# Patient Record
Sex: Male | Born: 1960
Health system: Southern US, Community
[De-identification: ages and names within clinical notes are randomized; demographics above are authoritative.]

## PROBLEM LIST (undated history)

## (undated) DIAGNOSIS — M19019 Primary osteoarthritis, unspecified shoulder: Secondary | ICD-10-CM

## (undated) DIAGNOSIS — M1990 Unspecified osteoarthritis, unspecified site: Secondary | ICD-10-CM

## (undated) DIAGNOSIS — I10 Essential (primary) hypertension: Secondary | ICD-10-CM

## (undated) DIAGNOSIS — Z789 Other specified health status: Secondary | ICD-10-CM

## (undated) DIAGNOSIS — M75102 Unspecified rotator cuff tear or rupture of left shoulder, not specified as traumatic: Secondary | ICD-10-CM

## (undated) DIAGNOSIS — M50221 Other cervical disc displacement at C4-C5 level: Secondary | ICD-10-CM

## (undated) HISTORY — DX: Primary osteoarthritis, unspecified shoulder: M19.019

## (undated) HISTORY — DX: Other cervical disc displacement at C4-C5 level: M50.221

## (undated) HISTORY — DX: Essential (primary) hypertension: I10

## (undated) HISTORY — DX: Unspecified rotator cuff tear or rupture of left shoulder, not specified as traumatic: M75.102

## (undated) HISTORY — PX: ROTATOR CUFF REPAIR: SHX139

---

## 1993-10-30 HISTORY — PX: KNEE ARTHROSCOPY W/ ACL RECONSTRUCTION: SHX1858

## 1998-08-31 ENCOUNTER — Encounter: Payer: Self-pay | Admitting: Neurosurgery

## 1998-08-31 ENCOUNTER — Ambulatory Visit (HOSPITAL_COMMUNITY): Admission: RE | Admit: 1998-08-31 | Discharge: 1998-08-31 | Payer: Self-pay | Admitting: Neurosurgery

## 1999-10-10 ENCOUNTER — Encounter: Payer: Self-pay | Admitting: Neurosurgery

## 1999-10-10 ENCOUNTER — Encounter: Admission: RE | Admit: 1999-10-10 | Discharge: 1999-10-10 | Payer: Self-pay | Admitting: Neurosurgery

## 1999-11-06 ENCOUNTER — Encounter: Payer: Self-pay | Admitting: Neurosurgery

## 1999-11-06 ENCOUNTER — Ambulatory Visit (HOSPITAL_COMMUNITY): Admission: RE | Admit: 1999-11-06 | Discharge: 1999-11-06 | Payer: Self-pay | Admitting: Neurosurgery

## 1999-12-23 ENCOUNTER — Encounter: Payer: Self-pay | Admitting: Neurosurgery

## 1999-12-23 ENCOUNTER — Ambulatory Visit (HOSPITAL_COMMUNITY): Admission: RE | Admit: 1999-12-23 | Discharge: 1999-12-23 | Payer: Self-pay | Admitting: Neurosurgery

## 2000-09-30 ENCOUNTER — Encounter: Payer: Self-pay | Admitting: Neurosurgery

## 2000-09-30 ENCOUNTER — Ambulatory Visit (HOSPITAL_COMMUNITY): Admission: RE | Admit: 2000-09-30 | Discharge: 2000-09-30 | Payer: Self-pay | Admitting: Neurosurgery

## 2001-09-02 ENCOUNTER — Encounter: Payer: Self-pay | Admitting: Neurosurgery

## 2001-09-02 ENCOUNTER — Ambulatory Visit (HOSPITAL_COMMUNITY): Admission: RE | Admit: 2001-09-02 | Discharge: 2001-09-02 | Payer: Self-pay | Admitting: Neurosurgery

## 2002-10-30 HISTORY — PX: BICEPS TENDON REPAIR: SHX566

## 2003-04-22 ENCOUNTER — Ambulatory Visit (HOSPITAL_COMMUNITY): Admission: RE | Admit: 2003-04-22 | Discharge: 2003-04-22 | Payer: Self-pay | Admitting: Sports Medicine

## 2003-04-23 ENCOUNTER — Ambulatory Visit (HOSPITAL_COMMUNITY): Admission: RE | Admit: 2003-04-23 | Discharge: 2003-04-23 | Payer: Self-pay | Admitting: *Deleted

## 2003-04-23 ENCOUNTER — Encounter: Payer: Self-pay | Admitting: *Deleted

## 2003-04-27 ENCOUNTER — Ambulatory Visit (HOSPITAL_BASED_OUTPATIENT_CLINIC_OR_DEPARTMENT_OTHER): Admission: RE | Admit: 2003-04-27 | Discharge: 2003-04-28 | Payer: Self-pay | Admitting: Orthopedic Surgery

## 2004-10-16 ENCOUNTER — Ambulatory Visit (HOSPITAL_COMMUNITY): Admission: RE | Admit: 2004-10-16 | Discharge: 2004-10-16 | Payer: Self-pay | Admitting: Neurosurgery

## 2005-11-06 ENCOUNTER — Emergency Department (HOSPITAL_COMMUNITY): Admission: EM | Admit: 2005-11-06 | Discharge: 2005-11-06 | Payer: Self-pay | Admitting: Emergency Medicine

## 2008-12-02 ENCOUNTER — Emergency Department (HOSPITAL_COMMUNITY): Admission: EM | Admit: 2008-12-02 | Discharge: 2008-12-02 | Payer: Self-pay | Admitting: Family Medicine

## 2008-12-23 ENCOUNTER — Emergency Department (HOSPITAL_COMMUNITY): Admission: EM | Admit: 2008-12-23 | Discharge: 2008-12-23 | Payer: Self-pay | Admitting: Family Medicine

## 2009-01-01 ENCOUNTER — Emergency Department (HOSPITAL_COMMUNITY): Admission: EM | Admit: 2009-01-01 | Discharge: 2009-01-01 | Payer: Self-pay | Admitting: Family Medicine

## 2009-01-12 ENCOUNTER — Ambulatory Visit (HOSPITAL_COMMUNITY): Admission: RE | Admit: 2009-01-12 | Discharge: 2009-01-12 | Payer: Self-pay | Admitting: Neurosurgery

## 2011-01-09 ENCOUNTER — Ambulatory Visit
Admission: RE | Admit: 2011-01-09 | Discharge: 2011-01-09 | Disposition: A | Discharge status: Home / Self Care | Payer: Commercial Managed Care - PPO | Source: Ambulatory Visit | Attending: Neurosurgery | Admitting: Neurosurgery

## 2011-01-09 ENCOUNTER — Other Ambulatory Visit: Payer: Self-pay | Admitting: Neurosurgery

## 2011-01-09 DIAGNOSIS — R269 Unspecified abnormalities of gait and mobility: Secondary | ICD-10-CM

## 2011-02-09 LAB — POCT URINALYSIS DIP (DEVICE)
Glucose, UA: NEGATIVE mg/dL
Nitrite: NEGATIVE
Urobilinogen, UA: 1 mg/dL (ref 0.0–1.0)

## 2011-03-17 NOTE — H&P (Signed)
NAME:  Alex Caldwell, Alex Caldwell                         ACCOUNT NO.:  [id number]   MEDICAL RECORD NO.:  [id number]                   PATIENT TYPE:  AMB   LOCATION:  DSC                                  FACILITY:  [facility]   PHYSICIAN:  Robert A. Thurston Hole, M.D.              DATE OF BIRTH:  Jan 09, 1961   DATE OF ADMISSION:  04/27/2003  DATE OF DISCHARGE:                                HISTORY & PHYSICAL   PREOPERATIVE DIAGNOSIS:  Left elbow distal biceps tendon rupture.   POSTOPERATIVE DIAGNOSIS:  Left elbow distal biceps tendon rupture.   PROCEDURES:  Left distal biceps tendon rupture repair.   SURGEON:  Elana Alm. Thurston Hole, M.D.   ASSISTANT:  Jonelle Sports. Frazier Richards, M.D.   ANESTHESIA:  General.   OPERATIVE TIME:  One hour.   COMPLICATIONS:  None.   INDICATIONS FOR PROCEDURE:  Alex Caldwell is a 50 year old gentleman who  sustained a left biceps tendon distal ruptured approximately two weeks ago.  Significant pain and disability.  He is now to undergo primary repair of  this.   DESCRIPTION OF PROCEDURE:  Alex Caldwell was brought to the operating room on  April 27, 2003, and placed on the operating table in the supine position.  After an adequate level of general anesthesia was obtained, his left arm was  prepped using sterile Duraprep and draped using sterile technique.  He  received Ancef 1 g IV preoperatively for prophylaxis.  The arm was  exsanguinated and the tourniquet elevated 275 mm.  Initially through a 7 cm  S-type incision based at the flexion crease of the elbow initial exposure  was made.  The underlying subcutaneous tissues were incised in line with the  skin incision.  The fascia over the biceps was exposed and carefully  dissected.  The brachial vein was carefully retracted.  Neurovascular bundle  carefully retracted while the ruptured tendon was easily identified.  It was  thoroughly debrided back to healthy-appearing tendon.  A weaving-type  locking stitch of #2 fiber wire was then  placed in it.  After this was done,  careful dissection was carried down to the radial head to the bicipital  tubercle of the proximal radius.  A small drill bit was then drilled into  the bicipital tuberosity and confirmed with fluoroscopy.  After this was  done, then an 8 mm drill was drilled over this, thus providing a bed for the  biceps tendon.  After this was done, then using the Arthrex biceps tenodesis  interference screw this was placed on the distal biceps tendon and then the  biceps tendon placed into the hole in the biceps tuberosity on the proximal  radius and then the screw screwed down in this with an excellent  interference fit.  After this was done, the arm could be brought through a  full range of motion with only slight excessive tension on the biceps in  full extension.  At this point then, the tourniquet was released.  No  excessive bleeding was encountered.  Small skin bleeders were cauterized.  The wound was then irrigated.  The wound was then closed with 2-0 Vicryl and  skin staples.  The patient then had a long arm splint placed.  He was then  awaken, extubated, and taken to recovery in stable condition.   FOLLOWUP CARE:  Alex Caldwell will be followed overnight in the recovery care  center for IV pain control and neurovascular monitoring.  Discharged  tomorrow on Percocet for pain.  Will see him back in the office in a week  for wound check and followup.                                               Robert A. Thurston Hole, M.D.    RAW/MEDQ  D:  04/27/2003  T:  04/27/2003  Job:  [id number]

## 2011-09-13 ENCOUNTER — Other Ambulatory Visit: Payer: Commercial Managed Care - PPO | Admitting: Internal Medicine

## 2012-01-11 ENCOUNTER — Other Ambulatory Visit (HOSPITAL_COMMUNITY): Payer: Self-pay | Admitting: Sports Medicine

## 2012-01-11 DIAGNOSIS — R1030 Lower abdominal pain, unspecified: Secondary | ICD-10-CM

## 2012-01-12 ENCOUNTER — Ambulatory Visit (HOSPITAL_COMMUNITY)
Admission: RE | Admit: 2012-01-12 | Discharge: 2012-01-12 | Disposition: A | Discharge status: Home / Self Care | Payer: 59 | Source: Ambulatory Visit | Attending: Sports Medicine | Admitting: Sports Medicine

## 2012-01-12 DIAGNOSIS — R1030 Lower abdominal pain, unspecified: Secondary | ICD-10-CM

## 2012-01-12 DIAGNOSIS — X58XXXA Exposure to other specified factors, initial encounter: Secondary | ICD-10-CM | POA: Insufficient documentation

## 2012-01-12 DIAGNOSIS — R109 Unspecified abdominal pain: Secondary | ICD-10-CM | POA: Insufficient documentation

## 2012-01-12 DIAGNOSIS — IMO0002 Reserved for concepts with insufficient information to code with codable children: Secondary | ICD-10-CM | POA: Insufficient documentation

## 2012-06-19 ENCOUNTER — Other Ambulatory Visit (HOSPITAL_COMMUNITY): Payer: Self-pay | Admitting: Family Medicine

## 2012-06-19 DIAGNOSIS — S43439A Superior glenoid labrum lesion of unspecified shoulder, initial encounter: Secondary | ICD-10-CM

## 2012-06-24 ENCOUNTER — Ambulatory Visit (HOSPITAL_COMMUNITY)
Admission: RE | Admit: 2012-06-24 | Discharge: 2012-06-24 | Disposition: A | Discharge status: Home / Self Care | Payer: 59 | Source: Ambulatory Visit | Attending: Family Medicine | Admitting: Family Medicine

## 2012-06-24 DIAGNOSIS — S46819A Strain of other muscles, fascia and tendons at shoulder and upper arm level, unspecified arm, initial encounter: Secondary | ICD-10-CM | POA: Insufficient documentation

## 2012-06-24 DIAGNOSIS — M19019 Primary osteoarthritis, unspecified shoulder: Secondary | ICD-10-CM | POA: Insufficient documentation

## 2012-06-24 DIAGNOSIS — S43439A Superior glenoid labrum lesion of unspecified shoulder, initial encounter: Secondary | ICD-10-CM

## 2012-06-24 DIAGNOSIS — M25519 Pain in unspecified shoulder: Secondary | ICD-10-CM | POA: Insufficient documentation

## 2012-06-24 DIAGNOSIS — X58XXXA Exposure to other specified factors, initial encounter: Secondary | ICD-10-CM | POA: Insufficient documentation

## 2012-06-24 MED ORDER — IOHEXOL 180 MG/ML  SOLN
5.0000 mL | Freq: Once | INTRAMUSCULAR | Status: AC | PRN
  Administered 2012-06-24: 5 mL via INTRAVENOUS

## 2012-06-24 MED ORDER — GADOBENATE DIMEGLUMINE 529 MG/ML IV SOLN
0.5000 mL | Freq: Once | INTRAVENOUS | Status: AC | PRN
  Administered 2012-06-24: 0.5 mL via INTRAVENOUS

## 2012-06-25 ENCOUNTER — Other Ambulatory Visit (HOSPITAL_COMMUNITY): Payer: 59

## 2012-07-16 ENCOUNTER — Encounter (HOSPITAL_BASED_OUTPATIENT_CLINIC_OR_DEPARTMENT_OTHER): Payer: Self-pay | Admitting: *Deleted

## 2012-07-16 NOTE — Progress Notes (Signed)
No heart or resp problems-no labs needed Come with overnight bag in case he needs to stay

## 2012-07-22 ENCOUNTER — Other Ambulatory Visit: Payer: Self-pay | Admitting: Physician Assistant

## 2012-07-22 ENCOUNTER — Encounter: Payer: Self-pay | Admitting: Physician Assistant

## 2012-07-22 DIAGNOSIS — M1990 Unspecified osteoarthritis, unspecified site: Secondary | ICD-10-CM

## 2012-07-22 DIAGNOSIS — M75102 Unspecified rotator cuff tear or rupture of left shoulder, not specified as traumatic: Secondary | ICD-10-CM

## 2012-07-22 DIAGNOSIS — M19019 Primary osteoarthritis, unspecified shoulder: Secondary | ICD-10-CM | POA: Insufficient documentation

## 2012-07-22 NOTE — H&P (Addendum)
Alex Caldwell is an 51 y.o. male.   Chief Complaint: left shoulder Rotator cuff repair HPI: Alex Caldwell is a 51 year old seen for evaluation of significant left shoulder pain that began with a hockey injury when he was swinging the stick and felt very sharp pain. He saw Dr. Dion Saucier and had a subacromial injection which helped temporarily. His pain has persisted and increased. He had an MRI arthrogram on 06/24/12 that has revealed a rotator cuff tear and mild AC joint spurring. He continues to have significant pain and night pain as well. We had previously performed a distal biceps tendon rupture repair on him in 2004 and this has done well.  Past Medical History  Diagnosis Date  . No pertinent past medical history   . DJD (degenerative joint disease)     Past Surgical History  Procedure Date  . Knee arthroscopy w/ acl reconstruction 1995    left  . Biceps tendon repair 2004    left    No family history on file. Social History:  reports that he has never smoked. He does not have any smokeless tobacco history on file. He reports that he drinks alcohol. He reports that he does not use illicit drugs.  Allergies: No Known Allergies   (Not in a hospital admission)  No results found for this or any previous visit (from the past 48 hour(s)). No results found.  Review of Systems  HENT: Negative.   Eyes: Negative.   Cardiovascular: Negative.   Gastrointestinal: Negative.   Genitourinary: Negative.   Musculoskeletal:       Left shoulder pain  Skin: Negative.   Neurological: Negative.   Endo/Heme/Allergies: Negative.   Psychiatric/Behavioral: Negative.     Height 6\' 1"  (1.854 m), weight 86.183 kg (190 lb). Physical Exam  Constitutional: He is oriented to person, place, and time. He appears well-developed and well-nourished.  HENT:  Head: Normocephalic and atraumatic.  Mouth/Throat: Oropharynx is clear and moist.  Eyes: EOM are normal.  Neck: Neck supple.  Cardiovascular: Normal  rate and regular rhythm.   Respiratory: Effort normal.  GI: Soft.  Genitourinary:       Not pertinent to current symptomatology therefore not examined.  Musculoskeletal:       Examination of his left shoulder reveals full range of motion with pain, pain on rotator cuff stressing with mild weakness no instability. Exam of the right shoulder reveals full range of motion without pain swelling weakness or instability. Vascular exam: pulses 2+ and symmetric.   Neurological: He is alert and oriented to person, place, and time.  Skin: Skin is warm and dry.  Psychiatric: He has a normal mood and affect. His behavior is normal. Judgment and thought content normal.    MRI arthrogram on 06/24/12 that has revealed a rotator cuff tear and mild AC joint spurring.     Assessment Patient Active Problem List  Diagnosis  . Rotator cuff tear, left  . DJD (degenerative joint disease)  . Acromioclavicular joint arthritis     Plan I talk to him about this in detail. I would recommend with significant pain and findings on MRI that we proceed with left shoulder arthroscopy with rotator cuff repair and subacromial decompression. Discussed risks benefits and possible complications of the surgery in detail and he understands this completely.   Rumi Taras J 07/22/2012, 8:14 PM

## 2012-07-23 ENCOUNTER — Encounter (HOSPITAL_BASED_OUTPATIENT_CLINIC_OR_DEPARTMENT_OTHER): Payer: Self-pay

## 2012-07-23 ENCOUNTER — Ambulatory Visit (HOSPITAL_BASED_OUTPATIENT_CLINIC_OR_DEPARTMENT_OTHER): Payer: 59 | Admitting: Anesthesiology

## 2012-07-23 ENCOUNTER — Encounter (HOSPITAL_BASED_OUTPATIENT_CLINIC_OR_DEPARTMENT_OTHER): Payer: Self-pay | Admitting: Anesthesiology

## 2012-07-23 ENCOUNTER — Encounter (HOSPITAL_BASED_OUTPATIENT_CLINIC_OR_DEPARTMENT_OTHER)
Admission: RE | Disposition: A | Discharge status: Home / Self Care | Payer: Self-pay | Source: Ambulatory Visit | Attending: Orthopedic Surgery

## 2012-07-23 ENCOUNTER — Ambulatory Visit (HOSPITAL_BASED_OUTPATIENT_CLINIC_OR_DEPARTMENT_OTHER)
Admission: RE | Admit: 2012-07-23 | Discharge: 2012-07-23 | Disposition: A | Discharge status: Home / Self Care | Payer: 59 | Source: Ambulatory Visit | Attending: Orthopedic Surgery | Admitting: Orthopedic Surgery

## 2012-07-23 DIAGNOSIS — M24119 Other articular cartilage disorders, unspecified shoulder: Secondary | ICD-10-CM | POA: Insufficient documentation

## 2012-07-23 DIAGNOSIS — M75102 Unspecified rotator cuff tear or rupture of left shoulder, not specified as traumatic: Secondary | ICD-10-CM | POA: Diagnosis present

## 2012-07-23 DIAGNOSIS — M25819 Other specified joint disorders, unspecified shoulder: Secondary | ICD-10-CM | POA: Insufficient documentation

## 2012-07-23 DIAGNOSIS — X500XXA Overexertion from strenuous movement or load, initial encounter: Secondary | ICD-10-CM | POA: Insufficient documentation

## 2012-07-23 DIAGNOSIS — Y9322 Activity, ice hockey: Secondary | ICD-10-CM | POA: Insufficient documentation

## 2012-07-23 DIAGNOSIS — M19019 Primary osteoarthritis, unspecified shoulder: Secondary | ICD-10-CM | POA: Insufficient documentation

## 2012-07-23 DIAGNOSIS — S43429A Sprain of unspecified rotator cuff capsule, initial encounter: Secondary | ICD-10-CM | POA: Insufficient documentation

## 2012-07-23 DIAGNOSIS — M1990 Unspecified osteoarthritis, unspecified site: Secondary | ICD-10-CM | POA: Insufficient documentation

## 2012-07-23 HISTORY — DX: Other specified health status: Z78.9

## 2012-07-23 HISTORY — DX: Unspecified osteoarthritis, unspecified site: M19.90

## 2012-07-23 LAB — POCT HEMOGLOBIN-HEMACUE
Hemoglobin: 13.5 g/dL (ref 13.0–17.0)
Hemoglobin: 8.1 g/dL — ABNORMAL LOW (ref 13.0–17.0)

## 2012-07-23 SURGERY — SHOULDER ARTHROSCOPY WITH ROTATOR CUFF REPAIR AND SUBACROMIAL DECOMPRESSION
Anesthesia: General | Site: Shoulder | Laterality: Left | Wound class: Clean

## 2012-07-23 MED ORDER — SUCCINYLCHOLINE CHLORIDE 20 MG/ML IJ SOLN
INTRAMUSCULAR | Status: DC | PRN
  Administered 2012-07-23: 100 mg via INTRAVENOUS

## 2012-07-23 MED ORDER — LACTATED RINGERS IV SOLN
INTRAVENOUS | Status: DC
  Administered 2012-07-23 (×2): via INTRAVENOUS

## 2012-07-23 MED ORDER — LIDOCAINE HCL (CARDIAC) 20 MG/ML IV SOLN
INTRAVENOUS | Status: DC | PRN
  Administered 2012-07-23: 80 mg via INTRAVENOUS

## 2012-07-23 MED ORDER — DEXAMETHASONE SODIUM PHOSPHATE 4 MG/ML IJ SOLN
INTRAMUSCULAR | Status: DC | PRN
  Administered 2012-07-23: 10 mg via INTRAVENOUS

## 2012-07-23 MED ORDER — DEXAMETHASONE SODIUM PHOSPHATE 4 MG/ML IJ SOLN
INTRAMUSCULAR | Status: DC | PRN
  Administered 2012-07-23: 4 mg

## 2012-07-23 MED ORDER — PROMETHAZINE HCL 25 MG/ML IJ SOLN: 6.2500 mg | INTRAMUSCULAR | Status: DC | PRN

## 2012-07-23 MED ORDER — CEFAZOLIN SODIUM-DEXTROSE 2-3 GM-% IV SOLR
2.0000 g | INTRAVENOUS | Status: AC
  Administered 2012-07-23: 2 g via INTRAVENOUS

## 2012-07-23 MED ORDER — EPINEPHRINE HCL 1 MG/ML IJ SOLN
INTRAMUSCULAR | Status: DC | PRN
  Administered 2012-07-23: 6 mL

## 2012-07-23 MED ORDER — ONDANSETRON HCL 4 MG/2ML IJ SOLN
INTRAMUSCULAR | Status: DC | PRN
  Administered 2012-07-23: 4 mg via INTRAVENOUS

## 2012-07-23 MED ORDER — HYDROMORPHONE HCL PF 1 MG/ML IJ SOLN: 0.2500 mg | INTRAMUSCULAR | Status: DC | PRN

## 2012-07-23 MED ORDER — SODIUM CHLORIDE 0.9 % IR SOLN
Status: DC | PRN
  Administered 2012-07-23: 6

## 2012-07-23 MED ORDER — PROPOFOL 10 MG/ML IV BOLUS
INTRAVENOUS | Status: DC | PRN
  Administered 2012-07-23: 200 mg via INTRAVENOUS

## 2012-07-23 MED ORDER — LIDOCAINE HCL 4 % MT SOLN
OROMUCOSAL | Status: DC | PRN
  Administered 2012-07-23: 4 mL via TOPICAL

## 2012-07-23 MED ORDER — CHLORHEXIDINE GLUCONATE 4 % EX LIQD: 60.0000 mL | Freq: Once | CUTANEOUS | Status: DC

## 2012-07-23 MED ORDER — FENTANYL CITRATE 0.05 MG/ML IJ SOLN
50.0000 ug | Freq: Once | INTRAMUSCULAR | Status: AC
  Administered 2012-07-23: 100 ug via INTRAVENOUS

## 2012-07-23 MED ORDER — OXYCODONE-ACETAMINOPHEN 5-325 MG PO TABS: ORAL_TABLET | ORAL | Status: DC

## 2012-07-23 MED ORDER — FENTANYL CITRATE 0.05 MG/ML IJ SOLN
INTRAMUSCULAR | Status: DC | PRN
  Administered 2012-07-23 (×2): 25 ug via INTRAVENOUS

## 2012-07-23 MED ORDER — METHOCARBAMOL 500 MG PO TABS: ORAL_TABLET | ORAL | Status: DC

## 2012-07-23 MED ORDER — MIDAZOLAM HCL 2 MG/2ML IJ SOLN
1.0000 mg | INTRAMUSCULAR | Status: DC | PRN
  Administered 2012-07-23: 2 mg via INTRAVENOUS

## 2012-07-23 MED ORDER — BUPIVACAINE-EPINEPHRINE PF 0.5-1:200000 % IJ SOLN
INTRAMUSCULAR | Status: DC | PRN
  Administered 2012-07-23: 25 mL

## 2012-07-23 MED ORDER — LACTATED RINGERS IV SOLN: INTRAVENOUS | Status: DC

## 2012-07-23 SURGICAL SUPPLY — 80 items
ANCH SUT 2 FT CRKSCW 14.7 STRL (Anchor) ×1 IMPLANT
ANCH SUT SWLK 19.1X5.5 CLS EL (Anchor) ×1 IMPLANT
ANCHOR CORKSCREW BIO 5.5 FT (Anchor) ×1 IMPLANT
ANCHOR PEEK SWIVEL LOCK 5.5 (Anchor) ×1 IMPLANT
APL SKNCLS STERI-STRIP NONHPOA (GAUZE/BANDAGES/DRESSINGS)
BENZOIN TINCTURE PRP APPL 2/3 (GAUZE/BANDAGES/DRESSINGS) IMPLANT
BLADE CUDA 5.5 (BLADE) IMPLANT
BLADE CUTTER GATOR 3.5 (BLADE) ×2 IMPLANT
BLADE GREAT WHITE 4.2 (BLADE) IMPLANT
BLADE SURG 15 STRL LF DISP TIS (BLADE) IMPLANT
BLADE SURG 15 STRL SS (BLADE)
BLADE SURG ROTATE 9660 (MISCELLANEOUS) IMPLANT
BNDG COHESIVE 4X5 TAN STRL (GAUZE/BANDAGES/DRESSINGS) ×1 IMPLANT
BUR OVAL 6.0 (BURR) ×2 IMPLANT
CANISTER OMNI JUG 16 LITER (MISCELLANEOUS) ×2 IMPLANT
CANISTER SUCTION 2500CC (MISCELLANEOUS) IMPLANT
CANNULA TWIST IN 8.25X7CM (CANNULA) IMPLANT
DECANTER SPIKE VIAL GLASS SM (MISCELLANEOUS) IMPLANT
DRAPE SHOULDER BEACH CHAIR (DRAPES) ×2 IMPLANT
DRAPE U-SHAPE 47X51 STRL (DRAPES) ×4 IMPLANT
DRSG PAD ABDOMINAL 8X10 ST (GAUZE/BANDAGES/DRESSINGS) ×2 IMPLANT
DURAPREP 26ML APPLICATOR (WOUND CARE) ×2 IMPLANT
ELECT REM PT RETURN 9FT ADLT (ELECTROSURGICAL) ×2
ELECTRODE REM PT RTRN 9FT ADLT (ELECTROSURGICAL) ×1 IMPLANT
GAUZE XEROFORM 1X8 LF (GAUZE/BANDAGES/DRESSINGS) ×2 IMPLANT
GLOVE BIO SURGEON STRL SZ 6.5 (GLOVE) ×1 IMPLANT
GLOVE BIO SURGEON STRL SZ7 (GLOVE) IMPLANT
GLOVE BIOGEL PI IND STRL 7.0 (GLOVE) IMPLANT
GLOVE BIOGEL PI IND STRL 7.5 (GLOVE) ×1 IMPLANT
GLOVE BIOGEL PI INDICATOR 7.0 (GLOVE) ×2
GLOVE BIOGEL PI INDICATOR 7.5 (GLOVE) ×1
GLOVE SS BIOGEL STRL SZ 7.5 (GLOVE) ×1 IMPLANT
GLOVE SUPERSENSE BIOGEL SZ 7.5 (GLOVE) ×1
GOWN PREVENTION PLUS XLARGE (GOWN DISPOSABLE) ×4 IMPLANT
LOOP 2 FIBERLINK CLOSED (SUTURE) ×1 IMPLANT
NDL 1/2 CIR CATGUT .05X1.09 (NEEDLE) IMPLANT
NDL SAFETY ECLIPSE 18X1.5 (NEEDLE) ×1 IMPLANT
NDL SCORPION MULTI FIRE (NEEDLE) IMPLANT
NDL SUT 6 .5 CRC .975X.05 MAYO (NEEDLE) IMPLANT
NEEDLE 1/2 CIR CATGUT .05X1.09 (NEEDLE) IMPLANT
NEEDLE HYPO 18GX1.5 SHARP (NEEDLE) ×2
NEEDLE MAYO TAPER (NEEDLE)
NEEDLE SCORPION MULTI FIRE (NEEDLE) ×2 IMPLANT
PACK ARTHROSCOPY DSU (CUSTOM PROCEDURE TRAY) ×2 IMPLANT
PACK BASIN DAY SURGERY FS (CUSTOM PROCEDURE TRAY) ×2 IMPLANT
PAD ALCOHOL SWAB (MISCELLANEOUS) ×4 IMPLANT
PENCIL BUTTON HOLSTER BLD 10FT (ELECTRODE) IMPLANT
SET ARTHROSCOPY TUBING (MISCELLANEOUS) ×2
SET ARTHROSCOPY TUBING LN (MISCELLANEOUS) ×1 IMPLANT
SHEET MEDIUM DRAPE 40X70 STRL (DRAPES) IMPLANT
SLEEVE SCD COMPRESS KNEE MED (MISCELLANEOUS) ×1 IMPLANT
SLING ARM FOAM STRAP LRG (SOFTGOODS) IMPLANT
SLING ARM FOAM STRAP MED (SOFTGOODS) IMPLANT
SLING ARM FOAM STRAP XLG (SOFTGOODS) IMPLANT
SLING ARM IMMOBILIZER MED (SOFTGOODS) IMPLANT
SLING ULTRA III MED (ORTHOPEDIC SUPPLIES) IMPLANT
SPONGE GAUZE 4X4 12PLY (GAUZE/BANDAGES/DRESSINGS) ×2 IMPLANT
SPONGE LAP 4X18 X RAY DECT (DISPOSABLE) IMPLANT
STRIP CLOSURE SKIN 1/2X4 (GAUZE/BANDAGES/DRESSINGS) IMPLANT
SUCTION FRAZIER TIP 10 FR DISP (SUCTIONS) IMPLANT
SUT ETHILON 3 0 PS 1 (SUTURE) ×2 IMPLANT
SUT FIBERWIRE #2 38 T-5 BLUE (SUTURE)
SUT PDS AB 2-0 CT2 27 (SUTURE) IMPLANT
SUT PROLENE 3 0 PS 2 (SUTURE) IMPLANT
SUT TIGER TAPE 7 IN WHITE (SUTURE) IMPLANT
SUT VIC AB 0 SH 27 (SUTURE) IMPLANT
SUT VIC AB 2-0 PS2 27 (SUTURE) IMPLANT
SUT VIC AB 2-0 SH 27 (SUTURE)
SUT VIC AB 2-0 SH 27XBRD (SUTURE) IMPLANT
SUTURE FIBERWR #2 38 T-5 BLUE (SUTURE) IMPLANT
SYR 20CC LL (SYRINGE) IMPLANT
SYR 5ML LL (SYRINGE) ×2 IMPLANT
SYR BULB 3OZ (MISCELLANEOUS) IMPLANT
TAPE FIBER 2MM 7IN #2 BLUE (SUTURE) ×1 IMPLANT
TAPE HYPAFIX 6X30 (GAUZE/BANDAGES/DRESSINGS) IMPLANT
TAPE STRIPS DRAPE STRL (GAUZE/BANDAGES/DRESSINGS) ×2 IMPLANT
TOWEL OR 17X24 6PK STRL BLUE (TOWEL DISPOSABLE) ×2 IMPLANT
TUBE CONNECTING 20X1/4 (TUBING) ×1 IMPLANT
WAND STAR VAC 90 (SURGICAL WAND) ×2 IMPLANT
WATER STERILE IRR 1000ML POUR (IV SOLUTION) ×2 IMPLANT

## 2012-07-23 NOTE — H&P (View-Only) (Signed)
Alex Caldwell is an 50 y.o. male.   Chief Complaint: left shoulder Rotator cuff repair HPI: Alex Caldwell is a 50 year old seen for evaluation of significant left shoulder pain that began with a hockey injury when he was swinging the stick and felt very sharp pain. He saw Dr. Landau and had a subacromial injection which helped temporarily. His pain has persisted and increased. He had an MRI arthrogram on 06/24/12 that has revealed a rotator cuff tear and mild AC joint spurring. He continues to have significant pain and night pain as well. We had previously performed a distal biceps tendon rupture repair on him in 2004 and this has done well.  Past Medical History  Diagnosis Date  . No pertinent past medical history   . DJD (degenerative joint disease)     Past Surgical History  Procedure Date  . Knee arthroscopy w/ acl reconstruction 1995    left  . Biceps tendon repair 2004    left    No family history on file. Social History:  reports that he has never smoked. He does not have any smokeless tobacco history on file. He reports that he drinks alcohol. He reports that he does not use illicit drugs.  Allergies: No Known Allergies   (Not in a hospital admission)  No results found for this or any previous visit (from the past 48 hour(s)). No results found.  Review of Systems  HENT: Negative.   Eyes: Negative.   Cardiovascular: Negative.   Gastrointestinal: Negative.   Genitourinary: Negative.   Musculoskeletal:       Left shoulder pain  Skin: Negative.   Neurological: Negative.   Endo/Heme/Allergies: Negative.   Psychiatric/Behavioral: Negative.     Height 6' 1" (1.854 m), weight 86.183 kg (190 lb). Physical Exam  Constitutional: He is oriented to person, place, and time. He appears well-developed and well-nourished.  HENT:  Head: Normocephalic and atraumatic.  Mouth/Throat: Oropharynx is clear and moist.  Eyes: EOM are normal.  Neck: Neck supple.  Cardiovascular: Normal  rate and regular rhythm.   Respiratory: Effort normal.  GI: Soft.  Genitourinary:       Not pertinent to current symptomatology therefore not examined.  Musculoskeletal:       Examination of his left shoulder reveals full range of motion with pain, pain on rotator cuff stressing with mild weakness no instability. Exam of the right shoulder reveals full range of motion without pain swelling weakness or instability. Vascular exam: pulses 2+ and symmetric.   Neurological: He is alert and oriented to person, place, and time.  Skin: Skin is warm and dry.  Psychiatric: He has a normal mood and affect. His behavior is normal. Judgment and thought content normal.    MRI arthrogram on 06/24/12 that has revealed a rotator cuff tear and mild AC joint spurring.     Assessment Patient Active Problem List  Diagnosis  . Rotator cuff tear, left  . DJD (degenerative joint disease)  . Acromioclavicular joint arthritis     Plan I talk to him about this in detail. I would recommend with significant pain and findings on MRI that we proceed with left shoulder arthroscopy with rotator cuff repair and subacromial decompression. Discussed risks benefits and possible complications of the surgery in detail and he understands this completely.   Alex Caldwell 07/22/2012, 8:14 PM    

## 2012-07-23 NOTE — Anesthesia Procedure Notes (Signed)
Anesthesia Regional Block:  Interscalene brachial plexus block  Pre-Anesthetic Checklist: ,, timeout performed, Correct Patient, Correct Site, Correct Laterality, Correct Procedure, Correct Position, site marked, Risks and benefits discussed,  Surgical consent,  Pre-op evaluation,  At surgeon's request and post-op pain management  Laterality: Left  Prep: chloraprep       Needles:  Injection technique: Single-shot  Needle Type: Stimulator Needle - 40     Needle Length:cm 4 cm Needle Gauge: 22 and 22 G    Additional Needles:  Procedures: nerve stimulator Interscalene brachial plexus block  Nerve Stimulator or Paresthesia:  Response: 0.48 mA,   Additional Responses:   Narrative:  Start time: 07/23/2012 11:56 AM End time: 07/23/2012 12:04 PM Injection made incrementally with aspirations every 5 mL. Anesthesiologist: Dr Gypsy Balsam  Additional Notes: L ISB POP CHG prep, sterile tech #[id number] stim needle w/stim down to .48ma Multiple neg asp Marc .5% w/epi 25cc+decadron 4mg  infiltrated No compl Dr Gypsy Balsam

## 2012-07-23 NOTE — Brief Op Note (Signed)
07/23/2012  2:17 PM  PATIENT:  Alex Caldwell  51 y.o. male  PRE-OPERATIVE DIAGNOSIS:  left shoulder impengement syndrome, degenerative arthristis , complete rupture of rotator cuff repair   POST-OPERATIVE DIAGNOSIS:  left shoulder impengement syndrome, degenerative arthristis , complete rupture of rotator cuff repair   PROCEDURE:  Left shoulder EUA, scope with RTC repair and labrum debridement, with SAD  SURGEON:  Surgeon(s) and Role:    * Nilda Simmer, MD - Primary  PHYSICIAN ASSISTANT: Kirstin Shepperson PA-C  ASSISTANTS:same  ANESTHESIA:   general  EBL:  Total I/O In: 1000 [I.V.:1000] Out: -   BLOOD ADMINISTERED:none  DRAINS: none   LOCAL MEDICATIONS USED:  NONE  SPECIMEN:  No Specimen  DISPOSITION OF SPECIMEN:  N/A  COUNTS:  YES  TOURNIQUET:  * No tourniquets in log *  DICTATION: .Note written in EPIC  PLAN OF CARE: Discharge to home after PACU  PATIENT DISPOSITION:  PACU - hemodynamically stable.   Delay start of Pharmacological VTE agent (>24hrs) due to surgical blood loss or risk of bleeding: not applicable

## 2012-07-23 NOTE — Interval H&P Note (Signed)
History and Physical Interval Note:  07/23/2012 12:47 PM  Alex Caldwell  has presented today for surgery, with the diagnosis of left shoulder impengement syndrome, degenerative arthristis, ac joint , complete rupture of rotator cuff repair   The various methods of treatment have been discussed with the patient and family. After consideration of risks, benefits and other options for treatment, the patient has consented to  Procedure(s) (LRB) with comments: SHOULDER ARTHROSCOPY WITH ROTATOR CUFF REPAIR AND SUBACROMIAL DECOMPRESSION (Left) - left shoulder scope decompression subacromial partial acromiopllasty with coracoacromial release , distal claviculectomy with rotator cuff repair as a surgical intervention .  The patient's history has been reviewed, patient examined, no change in status, stable for surgery.  I have reviewed the patient's chart and labs.  Questions were answered to the patient's satisfaction.     [name]

## 2012-07-23 NOTE — Progress Notes (Signed)
Assisted Dr. Kasik with left, interscalene  block. Side rails up, monitors on throughout procedure. See vital signs in flow sheet. Tolerated Procedure well. 

## 2012-07-23 NOTE — Anesthesia Postprocedure Evaluation (Signed)
Anesthesia Post Note  Patient: Alex Caldwell  Procedure(s) Performed: Procedure(s) (LRB): SHOULDER ARTHROSCOPY WITH ROTATOR CUFF REPAIR AND SUBACROMIAL DECOMPRESSION (Left)  Anesthesia type: general  Patient location: PACU  Post pain: Pain level controlled  Post assessment: Patient's Cardiovascular Status Stable  Last Vitals:  Filed Vitals:   07/23/12 1445  BP: 130/55  Pulse: 75  Temp:   Resp: 17    Post vital signs: Reviewed and stable  Level of consciousness: sedated  Complications: No apparent anesthesia complications

## 2012-07-23 NOTE — Anesthesia Preprocedure Evaluation (Signed)
Anesthesia Evaluation  Patient identified by MRN, date of birth, ID band Patient awake    Reviewed: Allergy & Precautions, H&P , NPO status , Patient's Chart, lab work & pertinent test results  Airway Mallampati: I TM Distance: >3 FB Neck ROM: Full    Dental   Pulmonary  breath sounds clear to auscultation        Cardiovascular Rhythm:Regular Rate:Normal     Neuro/Psych    GI/Hepatic   Endo/Other    Renal/GU      Musculoskeletal   Abdominal   Peds  Hematology   Anesthesia Other Findings   Reproductive/Obstetrics                           Anesthesia Physical Anesthesia Plan  ASA: I  Anesthesia Plan: General   Post-op Pain Management:    Induction: Intravenous  Airway Management Planned: Oral ETT  Additional Equipment:   Intra-op Plan:   Post-operative Plan: Extubation in OR  Informed Consent: I have reviewed the patients History and Physical, chart, labs and discussed the procedure including the risks, benefits and alternatives for the proposed anesthesia with the patient or authorized representative who has indicated his/her understanding and acceptance.     Plan Discussed with: CRNA and Surgeon  Anesthesia Plan Comments:         Anesthesia Quick Evaluation  

## 2012-07-23 NOTE — Transfer of Care (Signed)
Immediate Anesthesia Transfer of Care Note  Patient: Alex Caldwell  Procedure(s) Performed: Procedure(s) (LRB) with comments: SHOULDER ARTHROSCOPY WITH ROTATOR CUFF REPAIR AND SUBACROMIAL DECOMPRESSION (Left) - left shoulder scope decompression subacromial partial acromiopllasty with coracoacromial release , distal claviculectomy with rotator cuff repair  Patient Location: PACU  Anesthesia Type: GA combined with regional for post-op pain  Level of Consciousness: awake, alert  and oriented  Airway & Oxygen Therapy: Patient Spontanous Breathing and Patient connected to face mask oxygen  Post-op Assessment: Report given to PACU RN and Post -op Vital signs reviewed and stable  Post vital signs: Reviewed and stable  Complications: No apparent anesthesia complications

## 2012-07-24 NOTE — Op Note (Deleted)
NAME:  Alex Caldwell, Alex Caldwell               ACCOUNT NO.:  [id number]  MEDICAL RECORD NO.:  [id number]  LOCATION:                               FACILITY:  [facility]  PHYSICIAN:  Virlan Kempker A. Devonne Lalani, M.D. DATE OF BIRTH:  09/29/1961  DATE OF PROCEDURE:  07/23/2012 DATE OF DISCHARGE:  07/23/2012                              OPERATIVE REPORT   PREOPERATIVE DIAGNOSIS: 1. Left shoulder rotator cuff tear. 2. Left shoulder partial labrum tear. 3. Left shoulder impingement.  POSTOPERATIVE DIAGNOSIS: 1. Left shoulder rotator cuff tear. 2. Left shoulder partial labrum tear. 3. Left shoulder impingement.  PROCEDURE: 1. Left shoulder EUA followed by arthroscopically assisted rotator     cuff repair using Arthrex suture anchor x1 and Swivel lock anchor     with fiber tape x1. 2. Left shoulder debridement, partial labrum tear. 3. Left shoulder subacromial decompression.  SURGEON:  Kalei Mckillop A. Kelly Eisler, M.D.  ASSISTANT:  Kirstin Shepperson, PA-C.  ANESTHESIA:  General.  OPERATIVE TIME:  1 hour and 15 minutes.  COMPLICATIONS:  None.  INDICATION FOR PROCEDURE:  Alex Caldwell is a 50-year-old gentleman who has had significant increasing left shoulder pain over the past 5 months with exam and MRI documenting rotator cuff tear with partial labrum tear with impingement.  He has failed conservative care and is now to undergo arthroscopy and repair.  DESCRIPTION:  Alex Caldwell was brought to the operating room on July 23, 2012, after an interscalene block was placed on by anesthesia.  He was placed on operative table in supine position.  He received Ancef 2 g IV preoperatively for prophylaxis.  After being placed under general anesthesia, his left shoulder was examined.  He had full range of motion and shoulder was stable __________exam.  He was then placed in beach- chair position and his shoulder and arm were prepped using sterile DuraPrep and draped using sterile technique.  Time-out of procedure was called  and the correct left shoulder identified.  Initially, through a posterior arthroscopic portal, the arthroscope with a pump attached, placed into an anterior portal and arthroscopic probe was placed.  On initial inspection, the articular cartilage in the glenohumeral joint was intact.  He had partial tearing of the anterior, superior, and posterior labrum 25% which was debrided.  The anterior-inferior labrum and anterior-inferior glenohumeral ligament complex was intact.  Biceps tendon  anchor was slightly hypermobile but was otherwise well attached. The biceps tendon was intact.  Rotator cuff showed a complete tear of the supraspinatus in the anterior, 30% in the supraspinatus and this was partially debrided arthroscopically.  The rest of the rotator cuff was intact.  Inferior capsular recess free of pathology.  Subacromial space was entered and a lateral arthroscopic portal was made.  Moderately thickened bursitis was resected.  Impingement was noted and a subacromial decompression was carried out removing 6-8 mm of the undersurface of the anterior, anterolateral, anteromedial acromion and CA ligament release carried out as well.  The AC joint was not pathologic and thus was not resected.  The rotator cuff tear was further debrided arthroscopically and then through 2 lateral accessory portals the rotator cuff was repaired primarily with 1 Arthrex suture anchor with each of   the sutures in this anchor placed in a mattress suture technique and tied down with firm and tight fixation on the rotator cuff repair and then a Swivel lock anchor with a fiber tape through another portion of the tear and this was deployed in the lateral aspect of the greater tuberosity.  After this was done, the shoulder could be brought through full range of motion with no impingement on the repair.  At this point, it is felt that all pathology then satisfactorily addressed.  The instruments were  removed.  Portals  were closed with 3-0 nylon sutures. Sterile dressings were applied and a sling and then the patient awakened and taken to recovery room in stable condition.  FOLLOWUP CARE:  Alex Caldwell to be followed as an outpatient, on Percocet and Robaxin, and abduction sling.  Seen back in the office in a week for sutures and followup.     Thresia Ramanathan A. Carlie Corpus, M.D.     RAW/MEDQ  D:  07/24/2012  T:  07/24/2012  Job:  [id number] 

## 2012-07-24 NOTE — Op Note (Signed)
NAME:  Alex Caldwell, Alex Caldwell               ACCOUNT NO.:  [id number]  MEDICAL RECORD NO.:  [id number]  LOCATION:                               FACILITY:  [facility]  PHYSICIAN:  Elana Alm. Thurston Hole, M.D. DATE OF BIRTH:  24-Jul-1961  DATE OF PROCEDURE:  07/23/2012 DATE OF DISCHARGE:  07/23/2012                              OPERATIVE REPORT   PREOPERATIVE DIAGNOSIS: 1. Left shoulder rotator cuff tear. 2. Left shoulder partial labrum tear. 3. Left shoulder impingement.  POSTOPERATIVE DIAGNOSIS: 1. Left shoulder rotator cuff tear. 2. Left shoulder partial labrum tear. 3. Left shoulder impingement.  PROCEDURE: 1. Left shoulder EUA followed by arthroscopically assisted rotator     cuff repair using Arthrex suture anchor x1 and Swivel lock anchor     with fiber tape x1. 2. Left shoulder debridement, partial labrum tear. 3. Left shoulder subacromial decompression.  SURGEON:  Elana Alm. Thurston Hole, M.D.  ASSISTANT:  Kirstin Shepperson, PA-C.  ANESTHESIA:  General.  OPERATIVE TIME:  1 hour and 15 minutes.  COMPLICATIONS:  None.  INDICATION FOR PROCEDURE:  Mr. Alex Caldwell is a 51 year old gentleman who has had significant increasing left shoulder pain over the past 5 months with exam and MRI documenting rotator cuff tear with partial labrum tear with impingement.  He has failed conservative care and is now to undergo arthroscopy and repair.  DESCRIPTION:  Mr. Alex Caldwell was brought to the operating room on July 23, 2012, after an interscalene block was placed on by anesthesia.  He was placed on operative table in supine position.  He received Ancef 2 g IV preoperatively for prophylaxis.  After being placed under general anesthesia, his left shoulder was examined.  He had full range of motion and shoulder was stable __________exam.  He was then placed in beach- chair position and his shoulder and arm were prepped using sterile DuraPrep and draped using sterile technique.  Time-out of procedure was called  and the correct left shoulder identified.  Initially, through a posterior arthroscopic portal, the arthroscope with a pump attached, placed into an anterior portal and arthroscopic probe was placed.  On initial inspection, the articular cartilage in the glenohumeral joint was intact.  He had partial tearing of the anterior, superior, and posterior labrum 25% which was debrided.  The anterior-inferior labrum and anterior-inferior glenohumeral ligament complex was intact.  Biceps tendon  anchor was slightly hypermobile but was otherwise well attached. The biceps tendon was intact.  Rotator cuff showed a complete tear of the supraspinatus in the anterior, 30% in the supraspinatus and this was partially debrided arthroscopically.  The rest of the rotator cuff was intact.  Inferior capsular recess free of pathology.  Subacromial space was entered and a lateral arthroscopic portal was made.  Moderately thickened bursitis was resected.  Impingement was noted and a subacromial decompression was carried out removing 6-8 mm of the undersurface of the anterior, anterolateral, anteromedial acromion and CA ligament release carried out as well.  The [facility] joint was not pathologic and thus was not resected.  The rotator cuff tear was further debrided arthroscopically and then through 2 lateral accessory portals the rotator cuff was repaired primarily with 1 Arthrex suture anchor with each of  the sutures in this anchor placed in a mattress suture technique and tied down with firm and tight fixation on the rotator cuff repair and then a Swivel lock anchor with a fiber tape through another portion of the tear and this was deployed in the lateral aspect of the greater tuberosity.  After this was done, the shoulder could be brought through full range of motion with no impingement on the repair.  At this point, it is felt that all pathology then satisfactorily addressed.  The instruments were  removed.  Portals  were closed with 3-0 nylon sutures. Sterile dressings were applied and a sling and then the patient awakened and taken to recovery room in stable condition.  FOLLOWUP CARE:  Mr. Yerk to be followed as an outpatient, on Percocet and Robaxin, and abduction sling.  Seen back in the office in a week for sutures and followup.     Alex Caldwell A. Thurston Hole, M.D.     RAW/MEDQ  D:  07/24/2012  T:  07/24/2012  Job:  [id number]

## 2013-04-09 ENCOUNTER — Ambulatory Visit (INDEPENDENT_AMBULATORY_CARE_PROVIDER_SITE_OTHER): Payer: 59 | Admitting: Family Medicine

## 2013-04-09 ENCOUNTER — Encounter: Payer: Self-pay | Admitting: Family Medicine

## 2013-04-09 VITALS — BP 120/78 | HR 60 | Temp 98.6°F | Resp 18 | Ht 73.0 in | Wt 200.0 lb

## 2013-04-09 DIAGNOSIS — S61412A Laceration without foreign body of left hand, initial encounter: Secondary | ICD-10-CM | POA: Insufficient documentation

## 2013-04-09 DIAGNOSIS — S61409A Unspecified open wound of unspecified hand, initial encounter: Secondary | ICD-10-CM

## 2013-04-09 MED ORDER — CEPHALEXIN 500 MG PO CAPS: 500.0000 mg | ORAL_CAPSULE | Freq: Two times a day (BID) | ORAL | Status: DC

## 2013-04-09 MED ORDER — IBUPROFEN 800 MG PO TABS: 800.0000 mg | ORAL_TABLET | Freq: Three times a day (TID) | ORAL | Status: DC | PRN

## 2013-04-09 NOTE — Assessment & Plan Note (Signed)
Laceration repair done. He had good range of motion status post. This appears to be a more superficial cut. His tetanus is up-to-date. Will place him on Keflex twice a day to begin ibuprofen as needed for pain he are he has oxycodone at home from her previous surgery. He will followup in 7 days. Given red flags for infection or swelling

## 2013-04-09 NOTE — Patient Instructions (Addendum)
Take antibiotics Keep clean and covered F/U 1 week for suture removal Call for any signs of infection Laceration Care, Adult A laceration is a cut or lesion that goes through all layers of the skin and into the tissue just beneath the skin. TREATMENT  Some lacerations may not require closure. Some lacerations may not be able to be closed due to an increased risk of infection. It is important to see your caregiver as soon as possible after an injury to minimize the risk of infection and maximize the opportunity for successful closure. If closure is appropriate, pain medicines may be given, if needed. The wound will be cleaned to help prevent infection. Your caregiver will use stitches (sutures), staples, wound glue (adhesive), or skin adhesive strips to repair the laceration. These tools bring the skin edges together to allow for faster healing and a better cosmetic outcome. However, all wounds will heal with a scar. Once the wound has healed, scarring can be minimized by covering the wound with sunscreen during the day for 1 full year. HOME CARE INSTRUCTIONS  For sutures or staples:  Keep the wound clean and dry.  If you were given a bandage (dressing), you should change it at least once a day. Also, change the dressing if it becomes wet or dirty, or as directed by your caregiver.  Wash the wound with soap and water 2 times a day. Rinse the wound off with water to remove all soap. Pat the wound dry with a clean towel.  After cleaning, apply a thin layer of the antibiotic ointment as recommended by your caregiver. This will help prevent infection and keep the dressing from sticking.  You may shower as usual after the first 24 hours. Do not soak the wound in water until the sutures are removed.  Only take over-the-counter or prescription medicines for pain, discomfort, or fever as directed by your caregiver.  Get your sutures or staples removed as directed by your caregiver. For skin adhesive  strips:  Keep the wound clean and dry.  Do not get the skin adhesive strips wet. You may bathe carefully, using caution to keep the wound dry.  If the wound gets wet, pat it dry with a clean towel.  Skin adhesive strips will fall off on their own. You may trim the strips as the wound heals. Do not remove skin adhesive strips that are still stuck to the wound. They will fall off in time. For wound adhesive:  You may briefly wet your wound in the shower or bath. Do not soak or scrub the wound. Do not swim. Avoid periods of heavy perspiration until the skin adhesive has fallen off on its own. After showering or bathing, gently pat the wound dry with a clean towel.  Do not apply liquid medicine, cream medicine, or ointment medicine to your wound while the skin adhesive is in place. This may loosen the film before your wound is healed.  If a dressing is placed over the wound, be careful not to apply tape directly over the skin adhesive. This may cause the adhesive to be pulled off before the wound is healed.  Avoid prolonged exposure to sunlight or tanning lamps while the skin adhesive is in place. Exposure to ultraviolet light in the first year will darken the scar.  The skin adhesive will usually remain in place for 5 to 10 days, then naturally fall off the skin. Do not pick at the adhesive film. You may need a tetanus shot if:  You cannot remember when you had your last tetanus shot.  You have never had a tetanus shot. If you get a tetanus shot, your arm may swell, get red, and feel warm to the touch. This is common and not a problem. If you need a tetanus shot and you choose not to have one, there is a rare chance of getting tetanus. Sickness from tetanus can be serious. SEEK MEDICAL CARE IF:   You have redness, swelling, or increasing pain in the wound.  You see a red line that goes away from the wound.  You have yellowish-white fluid (pus) coming from the wound.  You have a  fever.  You notice a bad smell coming from the wound or dressing.  Your wound breaks open before or after sutures have been removed.  You notice something coming out of the wound such as wood or glass.  Your wound is on your hand or foot and you cannot move a finger or toe. SEEK IMMEDIATE MEDICAL CARE IF:   Your pain is not controlled with prescribed medicine.  You have severe swelling around the wound causing pain and numbness or a change in color in your arm, hand, leg, or foot.  Your wound splits open and starts bleeding.  You have worsening numbness, weakness, or loss of function of any joint around or beyond the wound.  You develop painful lumps near the wound or on the skin anywhere on your body. MAKE SURE YOU:   Understand these instructions.  Will watch your condition.  Will get help right away if you are not doing well or get worse. Document Released: 10/16/2005 Document Revised: 01/08/2012 Document Reviewed: 04/11/2011 [facility] Patient Information 2014 [place], [place].

## 2013-04-09 NOTE — Progress Notes (Signed)
  Subjective:    Patient ID: Alex Caldwell, male    DOB: November 21, 1960, 52 y.o.   MRN: [id number]  HPI   Patient presents with a laceration to his left thumb. He was using a hand saw when this all plate went across his thumb. He works in Aeronautical engineer. This happened about 30 minutes ago and he came straight to the office. His tetanus is up-to-date. Right hand dominant  Review of Systems - per above  GEN- denies fatigue, fever, weight loss,weakness, recent illness MSK- + joint pain, muscle aches, +injury       Objective:   Physical Exam  GEN- no cardiopulmonary distress, alert and oriented x 3 Skin- Left hand- THumb- 3cm laceration, mild swelling, no debri noted crosses PIP, no bone or tendon exposed, does not extend to nail bed MSK- Left hand- Thumb- normal flexion , liagments in tact, good ROM thumb, bone NT with palpation  Procedure- Laceration Repair Procedure explained to patient questions answered benefits and risks discussed Verbal consent obtained. Antiseptic-Betadine Anesthesia-lidocaine without EPI 4-0 Ethilon suture- 5 single interrupeted sutures placed Minimal blood loss Patient tolerated procedure well Bandage applied   Good ROM s/p suture placement in Thumb      Assessment & Plan:

## 2013-04-16 ENCOUNTER — Ambulatory Visit (INDEPENDENT_AMBULATORY_CARE_PROVIDER_SITE_OTHER): Payer: Self-pay | Admitting: Family Medicine

## 2013-04-16 ENCOUNTER — Ambulatory Visit: Payer: 59

## 2013-04-16 DIAGNOSIS — S61209A Unspecified open wound of unspecified finger without damage to nail, initial encounter: Secondary | ICD-10-CM

## 2014-08-12 ENCOUNTER — Telehealth (HOSPITAL_COMMUNITY): Payer: Self-pay

## 2014-08-18 ENCOUNTER — Encounter (HOSPITAL_COMMUNITY): Payer: Self-pay | Admitting: Psychiatry

## 2014-08-18 ENCOUNTER — Encounter (INDEPENDENT_AMBULATORY_CARE_PROVIDER_SITE_OTHER): Payer: Self-pay

## 2014-08-18 ENCOUNTER — Ambulatory Visit (INDEPENDENT_AMBULATORY_CARE_PROVIDER_SITE_OTHER): Payer: 59 | Admitting: Psychiatry

## 2014-08-18 VITALS — BP 132/66 | HR 71 | Ht 74.0 in | Wt 204.0 lb

## 2014-08-18 DIAGNOSIS — F329 Major depressive disorder, single episode, unspecified: Secondary | ICD-10-CM

## 2014-08-18 DIAGNOSIS — F32A Depression, unspecified: Secondary | ICD-10-CM

## 2014-08-18 MED ORDER — ESCITALOPRAM OXALATE 10 MG PO TABS: ORAL_TABLET | ORAL | Status: DC

## 2014-08-18 NOTE — Progress Notes (Signed)
[facility] Initial Assessment Note  THORSTEN CLIMER [id number] 53 y.o.  08/18/2014 1:57 PM  Chief Complaint:  My wife told me that I need to see a psychiatrist.  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  The patient told that his wife believe that he has midlife crisis and he should see a psychiatrist.  Patient admitted that he is having a lot of marital stress.  He's been manic with his wife for more than 22 years.  He endorsed that his marriage has been difficult however it got worse to 3 years ago when he had fair and his wife find out about the affair.  Since then his wife has no trust on him .  The patient has apologized multiple times but she is still unable to trust on him.  Patient told that since 3 years he has seen at least 4 different therapist under the pressure of his wife .  Patient told his wife is very controlling and sometime he feels that he is living in a cage.  Currently couple is seeing a marriage counselor Trey Paula.  Patient admitted sometime he gets very sad, irritable, angry because of the living situation.  Patient told he has given an assurance to his wife multiple times but she continues to watch him and follows him regularly.  Patient admitted this April he visit his ex-girlfriend because he was close by to her apartment but did not realize that his wife was watching and following him.  Patient told that he has a big argument with her life and since then he is living at his dad's house.  Patient admitted sometime sad depressed irritable , insomnia and having racing thoughts.  He also feels sometimes guilty because he did not make enough money and his wife is is making more money than him.  Patient Georgina Quint his wife is a Merchant navy officer at [employer].  Patient goal money has never been an issue in her life.  Patient feels  sometime low self-esteem, lack of confidence, decrease ego and lack of motivation to do things.  Patient told 20 years ago he wanted to leave his wife however due to their daughter he did not left her.  Patient does sometime his wife calls him more than 50 times if he do not responsie her.  Patient told today while waiting to see the writer she had asked multiple times if he is going to see the psychiatrist.  Patient feels sometime anxious and nervous about the situation.  He admitted decreased libido, poor attention, poor concentration and decreased energy level.  He has never prescribed antidepressant.  He denies any hallucinations, mania, psychosis or any aggression or violence.  He admitted drinking on social occasions.  Patient is a retired Agricultural consultant.  He has his own lawn scape business.  Patient denies any feeling of hopelessness or worthlessness and anhedonia.  He described sad and depressed mood, insomnia and anxiety.  He denies any active or passive suicidal parts of homicidal thought.  He does not use any illegal substances.  He denies any panic attack, obsessive or compulsive thinking.  Currently he is not seeing therapist individually but seeing Trey Paula as a marriage counselor.  Suicidal Ideation: No Plan Formed: No Patient has means to carry out plan: No  Homicidal Ideation: No Plan Formed: No Patient has means to carry out plan:  No   Past Psychiatric History/Hospitalization(s) Patient denies any history of psychiatric inpatient treatment, suicidal attempt, mania, psychosis or any hallucination.  He has never tried any antidepressant.  He has seen at least 3 or 4 therapist in the past. Anxiety: Yes Bipolar Disorder: No Depression: Yes Mania: No Psychosis: No Schizophrenia: No Personality Disorder: No Hospitalization for psychiatric illness: No History of Electroconvulsive Shock Therapy: No Prior Suicide Attempts: No  Medical History; Patient has history of degenerative joint  disease, history of rotator cuff injury.  Patient has no primary care physician.  Patient denies any history of headaches, seizures or any traumatic brain injury.    Traumatic brain injury: Patient denies any history of traumatic brain injury.  Family History; Patient denies any family history of depression.  Education and Work History; Patient is a high school education.  He owned Lawyer.    Psychosocial History; Patient was born and raised in [place].  He's been married for more than 23 years.  Together they have a daughter who is in a pharmacy school.  Patient and mother is deceased.  He had a good relationship with his father.  Due to recent marital istress he is living with his father.  Legal History; Patient denies any legal issues.  History Of Abuse; Patient denies any history of abuse.  Substance Abuse History; Patient endorsed social drinking but denies any binge, intoxication, blackouts or any tremors.  Denies any illegal substance use.   Review of Systems: Psychiatric: Agitation: No Hallucination: No Depressed Mood: Yes Insomnia: Yes Hypersomnia: No Altered Concentration: No Feels Worthless: No Grandiose Ideas: No Belief In Special Powers: No New/Increased Substance Abuse: No Compulsions: No  Neurologic: Headache: No Seizure: No Paresthesias: No   Musculoskeletal: Strength & Muscle Tone: within normal limits Gait & Station: normal Patient leans: N/A   Outpatient Encounter Prescriptions as of 08/18/2014  Medication Sig  . escitalopram (LEXAPRO) 10 MG tablet Take 1/2 tab daily for 1 week and than 1 tab daily  . [DISCONTINUED] cephALEXin (KEFLEX) 500 MG capsule Take 1 capsule (500 mg total) by mouth 2 (two) times daily.  . [DISCONTINUED] ibuprofen (ADVIL,MOTRIN) 800 MG tablet Take 1 tablet (800 mg total) by mouth every 8 (eight) hours as needed for pain.    No results found for this or any previous visit (from the past 2160  hour(s)).    Constitutional:  BP 132/66  Pulse 71  Ht 6\' 2"  (1.88 m)  Wt 204 lb (92.534 kg)  BMI 26.18 kg/m2   Mental Status Examination;  Patient is casually dressed and fairly groomed.  He appears anxious but cooperative.  He maintained good eye contact.  He described his mood as sad and anxious.  His affect is constricted.  He denies any auditory or visual hallucination.  He denies any active or passive suicidal parts or homicidal thought.  His psychomotor activity is slightly decreased.  His fund of knowledge is good.  There were no delusions, paranoia or any obsessive thoughts.  There are no flight of ideas or any loose association.  His cognition is intact.. There are no tremors or shakes.  He is alert and oriented x3.  His insight judgment and impulse control is okay.   Review of Psycho-Social Stressors (1), Review or order clinical lab tests (1), Review and summation of old records (2), Established Problem, Worsening (2), Review of Medication Regimen & Side Effects (2) and Review of New Medication or Change in Dosage (2)  Assessment: Axis I: Depressive  disorder NOS  Axis II: Deferred  Axis III:  Past Medical History  Diagnosis Date  . No pertinent past medical history   . DJD (degenerative joint disease)   . Rotator cuff tear, left   . Acromioclavicular joint arthritis     Left shoulder    Axis IV: Mild to moderate   Plan:  I review his symptoms, collateral information and history.  Patient is under a lot of stress from his marriage.  He admitted that he has done wrong 3 years ago when he was having a fair.  He has a lot of guilt about it.  He is trying to build his trust to his life however he admitted that sometimes frustrated.  Patient told that couple is seeing a marriage Social worker.  The patient told he is under a lot of pressure from his wife to see a psychiatrist.  I discuss signs symptoms of his depression and after some encouragement he is willing to try low-dose  antidepressant.  I will start Lexapro 5 mg initially and gradually increase to 10 mg daily.  Discuss medication side effects including metabolic syndrome, sexual side effects and they gain.  I will get collateral information.  I also recommended to have his physical and blood work.  Recommended to call us back if he has any question or any concern.  I will see him again in 3 weeks. Time spent 55 minutes.  More than 50% of the time spent in psychoeducation, counseling and coordination of care.  Discuss safety plan that anytime having active suicidal thoughts or homicidal thoughts then patient need to call 911 or go to the local emergency room.    Cresencio Reesor T., MD 08/18/2014

## 2014-09-08 ENCOUNTER — Ambulatory Visit (HOSPITAL_COMMUNITY): Payer: Self-pay | Admitting: Psychiatry

## 2014-09-08 ENCOUNTER — Ambulatory Visit (HOSPITAL_COMMUNITY): Payer: 59 | Admitting: Psychiatry

## 2014-10-28 ENCOUNTER — Other Ambulatory Visit: Payer: 59

## 2014-10-28 DIAGNOSIS — Z0000 Encounter for general adult medical examination without abnormal findings: Secondary | ICD-10-CM

## 2014-10-28 LAB — CBC WITH DIFFERENTIAL/PLATELET
BASOS PCT: 1 % (ref 0–1)
Basophils Absolute: 0 10*3/uL (ref 0.0–0.1)
Eosinophils Absolute: 0 10*3/uL (ref 0.0–0.7)
Eosinophils Relative: 1 % (ref 0–5)
HCT: 44.3 % (ref 39.0–52.0)
HEMOGLOBIN: 15.5 g/dL (ref 13.0–17.0)
LYMPHS ABS: 1.2 10*3/uL (ref 0.7–4.0)
LYMPHS PCT: 27 % (ref 12–46)
MCH: 31.6 pg (ref 26.0–34.0)
MCHC: 35 g/dL (ref 30.0–36.0)
MCV: 90.2 fL (ref 78.0–100.0)
MONOS PCT: 6 % (ref 3–12)
MPV: 9.6 fL (ref 8.6–12.4)
Monocytes Absolute: 0.3 10*3/uL (ref 0.1–1.0)
NEUTROS ABS: 2.9 10*3/uL (ref 1.7–7.7)
NEUTROS PCT: 65 % (ref 43–77)
Platelets: 242 10*3/uL (ref 150–400)
RBC: 4.91 MIL/uL (ref 4.22–5.81)
RDW: 13.1 % (ref 11.5–15.5)
WBC: 4.4 10*3/uL (ref 4.0–10.5)

## 2014-10-29 LAB — COMPREHENSIVE METABOLIC PANEL
ALK PHOS: 46 U/L (ref 39–117)
ALT: 27 U/L (ref 0–53)
AST: 33 U/L (ref 0–37)
Albumin: 4.5 g/dL (ref 3.5–5.2)
BILIRUBIN TOTAL: 0.8 mg/dL (ref 0.2–1.2)
BUN: 16 mg/dL (ref 6–23)
CO2: 28 meq/L (ref 19–32)
Calcium: 9.3 mg/dL (ref 8.4–10.5)
Chloride: 101 mEq/L (ref 96–112)
Creat: 1.26 mg/dL (ref 0.50–1.35)
Glucose, Bld: 90 mg/dL (ref 70–99)
Potassium: 4.2 mEq/L (ref 3.5–5.3)
SODIUM: 139 meq/L (ref 135–145)
TOTAL PROTEIN: 7.2 g/dL (ref 6.0–8.3)

## 2014-10-29 LAB — PSA: PSA: 0.47 ng/mL (ref <=4.00)

## 2014-10-29 LAB — LIPID PANEL
CHOL/HDL RATIO: 2.9 ratio
Cholesterol: 206 mg/dL — ABNORMAL HIGH (ref 0–200)
HDL: 72 mg/dL (ref >39)
LDL Cholesterol: 124 mg/dL — ABNORMAL HIGH (ref 0–99)
TRIGLYCERIDES: 49 mg/dL (ref <150)
VLDL: 10 mg/dL (ref 0–40)

## 2014-11-03 ENCOUNTER — Encounter: Payer: Self-pay | Admitting: Family Medicine

## 2014-11-03 ENCOUNTER — Ambulatory Visit (INDEPENDENT_AMBULATORY_CARE_PROVIDER_SITE_OTHER): Payer: 59 | Admitting: Family Medicine

## 2014-11-03 VITALS — BP 126/84 | HR 68 | Temp 97.8°F | Resp 18 | Ht 72.5 in | Wt 204.0 lb

## 2014-11-03 DIAGNOSIS — Z23 Encounter for immunization: Secondary | ICD-10-CM | POA: Diagnosis not present

## 2014-11-03 DIAGNOSIS — Z0000 Encounter for general adult medical examination without abnormal findings: Secondary | ICD-10-CM

## 2014-11-03 NOTE — Progress Notes (Signed)
Subjective:    Patient ID: Alex Caldwell, male    DOB: 1961-06-21, 54 y.o.   MRN: [id number]  HPI Patient is in today for complete physical exam. I have recommended that the patient see his dermatologistHe has no concerns. His prostate exam was performed at the urologist office every year. He is due for a colonoscopy. He is due for a flu shot. His tetanus shot was performed in 2013. His most recent lab work as listed below Lab on 10/28/2014  Component Date Value Ref Range Status  . WBC 10/28/2014 4.4  4.0 - 10.5 K/uL Final  . RBC 10/28/2014 4.91  4.22 - 5.81 MIL/uL Final  . Hemoglobin 10/28/2014 15.5  13.0 - 17.0 g/dL Final  . HCT 10/28/2014 44.3  39.0 - 52.0 % Final  . MCV 10/28/2014 90.2  78.0 - 100.0 fL Final  . MCH 10/28/2014 31.6  26.0 - 34.0 pg Final  . MCHC 10/28/2014 35.0  30.0 - 36.0 g/dL Final  . RDW 10/28/2014 13.1  11.5 - 15.5 % Final  . Platelets 10/28/2014 242  150 - 400 K/uL Final  . MPV 10/28/2014 9.6  8.6 - 12.4 fL Final   ** Please note change in reference range(s). **  . Neutrophils Relative % 10/28/2014 65  43 - 77 % Final  . Neutro Abs 10/28/2014 2.9  1.7 - 7.7 K/uL Final  . Lymphocytes Relative 10/28/2014 27  12 - 46 % Final  . Lymphs Abs 10/28/2014 1.2  0.7 - 4.0 K/uL Final  . Monocytes Relative 10/28/2014 6  3 - 12 % Final  . Monocytes Absolute 10/28/2014 0.3  0.1 - 1.0 K/uL Final  . Eosinophils Relative 10/28/2014 1  0 - 5 % Final  . Eosinophils Absolute 10/28/2014 0.0  0.0 - 0.7 K/uL Final  . Basophils Relative 10/28/2014 1  0 - 1 % Final  . Basophils Absolute 10/28/2014 0.0  0.0 - 0.1 K/uL Final  . Smear Review 10/28/2014 Criteria for review not met   Final  . Sodium 10/28/2014 139  135 - 145 mEq/L Final  . Potassium 10/28/2014 4.2  3.5 - 5.3 mEq/L Final  . Chloride 10/28/2014 101  96 - 112 mEq/L Final  . CO2 10/28/2014 28  19 - 32 mEq/L Final  . Glucose, Bld 10/28/2014 90  70 - 99 mg/dL Final  . BUN 10/28/2014 16  6 - 23 mg/dL Final  . Creat  10/28/2014 1.26  0.50 - 1.35 mg/dL Final  . Total Bilirubin 10/28/2014 0.8  0.2 - 1.2 mg/dL Final  . Alkaline Phosphatase 10/28/2014 46  39 - 117 U/L Final  . AST 10/28/2014 33  0 - 37 U/L Final  . ALT 10/28/2014 27  0 - 53 U/L Final  . Total Protein 10/28/2014 7.2  6.0 - 8.3 g/dL Final  . Albumin 10/28/2014 4.5  3.5 - 5.2 g/dL Final  . Calcium 10/28/2014 9.3  8.4 - 10.5 mg/dL Final  . Cholesterol 10/28/2014 206* 0 - 200 mg/dL Final   Comment: ATP III Classification:       < 200        mg/dL        Desirable      200 - 239     mg/dL        Borderline High      >= 240        mg/dL        High     . Triglycerides 10/28/2014 49  <  150 mg/dL Final  . HDL 10/28/2014 72  >39 mg/dL Final  . Total CHOL/HDL Ratio 10/28/2014 2.9   Final  . VLDL 10/28/2014 10  0 - 40 mg/dL Final  . LDL Cholesterol 10/28/2014 124* 0 - 99 mg/dL Final   Comment:   Total Cholesterol/HDL Ratio:CHD Risk                        Coronary Heart Disease Risk Table                                        Men       Women          1/2 Average Risk              3.4        3.3              Average Risk              5.0        4.4           2X Average Risk              9.6        7.1           3X Average Risk             23.4       11.0 Use the calculated Patient Ratio above and the CHD Risk table  to determine the patient's CHD Risk. ATP III Classification (LDL):       < 100        mg/dL         Optimal      100 - 129     mg/dL         Near or Above Optimal      130 - 159     mg/dL         Borderline High      160 - 189     mg/dL         High       > 190        mg/dL         Very High     . PSA 10/28/2014 0.47  <=4.00 ng/mL Final   Comment: Test Methodology: ECLIA PSA (Electrochemiluminescence Immunoassay)   For PSA values from 2.5-4.0, particularly in younger men <55 years old, the AUA and NCCN suggest testing for % Free PSA (3515) and evaluation of the rate of increase in PSA (PSA velocity).    Past Medical  History  Diagnosis Date  . No pertinent past medical history   . DJD (degenerative joint disease)   . Rotator cuff tear, left   . Acromioclavicular joint arthritis     Left shoulder   Past Surgical History  Procedure Laterality Date  . Knee arthroscopy w/ acl reconstruction  1995    left  . Biceps tendon repair  2004    left   Current Outpatient Prescriptions on File Prior to Visit  Medication Sig Dispense Refill  . escitalopram (LEXAPRO) 10 MG tablet Take 1/2 tab daily for 1 week and than 1 tab daily 30 tablet 0   No current facility-administered medications on file prior to visit.   No Known Allergies History   Social  History  . Marital Status: Married    Spouse Name: N/A    Number of Children: N/A  . Years of Education: N/A   Occupational History  . Not on file.   Social History Main Topics  . Smoking status: Never Smoker   . Smokeless tobacco: Never Used  . Alcohol Use: 0.0 oz/week    0 Not specified per week     Comment: occ  . Drug Use: No  . Sexual Activity: Not on file   Other Topics Concern  . Not on file   Social History Narrative   Family History  Problem Relation Age of Onset  . Cancer Mother     ovarian  . Hyperlipidemia Father   . Hypertension Father       Review of Systems  All other systems reviewed and are negative.      Objective:   Physical Exam  Constitutional: He is oriented to person, place, and time. He appears well-developed and well-nourished. No distress.  HENT:  Head: Normocephalic and atraumatic.  Right Ear: External ear normal.  Left Ear: External ear normal.  Nose: Nose normal.  Mouth/Throat: Oropharynx is clear and moist. No oropharyngeal exudate.  Eyes: Conjunctivae and EOM are normal. Pupils are equal, round, and reactive to light. Right eye exhibits no discharge. Left eye exhibits no discharge. No scleral icterus.  Neck: Normal range of motion. Neck supple. No JVD present. No tracheal deviation present. No  thyromegaly present.  Cardiovascular: Normal rate, regular rhythm, normal heart sounds and intact distal pulses.  Exam reveals no gallop and no friction rub.   No murmur heard. Pulmonary/Chest: Effort normal and breath sounds normal. No stridor. No respiratory distress. He has no wheezes. He has no rales. He exhibits no tenderness.  Abdominal: Soft. Bowel sounds are normal. He exhibits no distension and no mass. There is no tenderness. There is no rebound and no guarding.  Musculoskeletal: Normal range of motion. He exhibits no edema or tenderness.  Lymphadenopathy:    He has no cervical adenopathy.  Neurological: He is alert and oriented to person, place, and time. He has normal reflexes. He displays normal reflexes. No cranial nerve deficit. He exhibits normal muscle tone. Coordination normal.  Skin: Skin is warm. No rash noted. He is not diaphoretic. No erythema. No pallor.  Psychiatric: He has a normal mood and affect. His behavior is normal. Judgment and thought content normal.  Vitals reviewed.         Assessment & Plan:  Routine general medical examination at a health care facility - Plan: Ambulatory referral to Gastroenterology  Need for prophylactic vaccination and inoculation against influenza - Plan: Flu Vaccine QUAD 36+ mos IM  His physical exam is normal except for a hard scaly papule on his left ear and a similar papule on his right neck.  I have recommended he see his dermatologist and use his efudex as recommended.  Patient received his flu shot.  I will schedule his colonoscopy.

## 2014-11-10 ENCOUNTER — Encounter: Payer: 59 | Admitting: Family Medicine

## 2014-12-21 ENCOUNTER — Encounter: Payer: Self-pay | Admitting: Family Medicine

## 2014-12-21 ENCOUNTER — Ambulatory Visit (INDEPENDENT_AMBULATORY_CARE_PROVIDER_SITE_OTHER): Payer: 59 | Admitting: Family Medicine

## 2014-12-21 VITALS — BP 140/80 | HR 76 | Temp 97.7°F | Resp 18 | Ht 74.0 in | Wt 206.0 lb

## 2014-12-21 DIAGNOSIS — J988 Other specified respiratory disorders: Principal | ICD-10-CM

## 2014-12-21 DIAGNOSIS — B349 Viral infection, unspecified: Secondary | ICD-10-CM

## 2014-12-21 DIAGNOSIS — B9789 Other viral agents as the cause of diseases classified elsewhere: Secondary | ICD-10-CM

## 2014-12-21 DIAGNOSIS — J020 Streptococcal pharyngitis: Secondary | ICD-10-CM

## 2014-12-21 DIAGNOSIS — R509 Fever, unspecified: Secondary | ICD-10-CM

## 2014-12-21 LAB — RAPID STREP SCREEN (MED CTR MEBANE ONLY): Streptococcus, Group A Screen (Direct): POSITIVE — AB

## 2014-12-21 LAB — INFLUENZA A AND B
INFLUENZA A AG: NEGATIVE
Influenza B Ag: NEGATIVE

## 2014-12-21 MED ORDER — AMOXICILLIN 875 MG PO TABS: 875.0000 mg | ORAL_TABLET | Freq: Two times a day (BID) | ORAL | Status: DC

## 2014-12-21 NOTE — Progress Notes (Signed)
   Subjective:    Patient ID: Alex Caldwell, male    DOB: [birth year], 54 y.o.   MRN: [id number]  HPI Patient's wife was tested positive for the flu on Friday night. The patient has had 4 days of severe sore throat, body aches, and some runny nose. He denies any fever. His biggest concern is a sore throat. He denies any cough. He denies any myalgias. Flu test today is negative but strep throat test is positive. Past Medical History  Diagnosis Date  . No pertinent past medical history   . DJD (degenerative joint disease)   . Rotator cuff tear, left   . Acromioclavicular joint arthritis     Left shoulder   Past Surgical History  Procedure Laterality Date  . Knee arthroscopy w/ acl reconstruction  1995    left  . Biceps tendon repair  2004    left   Current Outpatient Prescriptions on File Prior to Visit  Medication Sig Dispense Refill  . fluorouracil (EFUDEX) 5 % cream As needed from dermatologist  3   No current facility-administered medications on file prior to visit.   No Known Allergies History   Social History  . Marital Status: Married    Spouse Name: N/A  . Number of Children: N/A  . Years of Education: N/A   Occupational History  . Not on file.   Social History Main Topics  . Smoking status: Never Smoker   . Smokeless tobacco: Never Used  . Alcohol Use: 0.0 oz/week    0 Standard drinks or equivalent per week     Comment: occ  . Drug Use: No  . Sexual Activity: Not on file   Other Topics Concern  . Not on file   Social History Narrative      Review of Systems  All other systems reviewed and are negative.      Objective:   Physical Exam  Constitutional: He appears well-developed and well-nourished.  HENT:  Nose: Nose normal.  Mouth/Throat: Posterior oropharyngeal erythema present.  Eyes: Conjunctivae are normal.  Neck: Neck supple.  Cardiovascular: Normal rate and regular rhythm.   Pulmonary/Chest: Effort normal and breath sounds normal.    Lymphadenopathy:    He has cervical adenopathy.  Vitals reviewed.         Assessment & Plan:  Viral respiratory illness  Other specified fever - Plan: Influenza a and b, Rapid strep screen  Streptococcal sore throat - Plan: amoxicillin (AMOXIL) 875 MG tablet  Patient has a viral illness most likely explaining his rhinorrhea however he does have strep throat. Begin amoxicillin 875 mg by mouth twice a day for 10 days

## 2015-03-09 ENCOUNTER — Ambulatory Visit: Payer: 59 | Admitting: Family Medicine

## 2015-07-19 ENCOUNTER — Encounter: Payer: Self-pay | Admitting: Family Medicine

## 2015-07-19 ENCOUNTER — Ambulatory Visit (INDEPENDENT_AMBULATORY_CARE_PROVIDER_SITE_OTHER): Payer: 59 | Admitting: Family Medicine

## 2015-07-19 VITALS — BP 162/98 | HR 74 | Temp 98.1°F | Resp 18

## 2015-07-19 DIAGNOSIS — T2642XA Burn of left eye and adnexa, part unspecified, initial encounter: Secondary | ICD-10-CM

## 2015-07-19 DIAGNOSIS — T2692XA Corrosion of left eye and adnexa, part unspecified, initial encounter: Secondary | ICD-10-CM

## 2015-07-19 MED ORDER — ERYTHROMYCIN 5 MG/GM OP OINT: 1.0000 "application " | TOPICAL_OINTMENT | Freq: Two times a day (BID) | OPHTHALMIC | Status: DC

## 2015-07-19 NOTE — Progress Notes (Signed)
   Subjective:    Patient ID: Alex Caldwell, male    DOB: Nov 12, 1960, 54 y.o.   MRN: [id number]  HPI  Patient was pressure washing his father's home. He was using a solution of diluted Clorox. One drop of diluted solution fell into his left eye. His left eye appears chemically injected. He rinsed it out with a hose pipe for sure. Time right after. I checked the pH in his left eye and he was found to be greater than 7.0 based on lithmus paper.  Immediately we rinsed his eyes out with over 2 L of normal saline. I checked his pH in his eye every 5 minutes. His pH quickly returned to approximately 7.4 immediately after rinsing his eye copiously with saline. We then performed a vision screen and there is no evidence of impaired vision. I then performed a fluoroscopy seen exam. There were no corneal ulcers or abrasions. Past Medical History  Diagnosis Date  . No pertinent past medical history   . DJD (degenerative joint disease)   . Rotator cuff tear, left   . Acromioclavicular joint arthritis     Left shoulder   Past Surgical History  Procedure Laterality Date  . Knee arthroscopy w/ acl reconstruction  1995    left  . Biceps tendon repair  2004    left   No current outpatient prescriptions on file prior to visit.   No current facility-administered medications on file prior to visit.   No Known Allergies Social History   Social History  . Marital Status: Married    Spouse Name: N/A  . Number of Children: N/A  . Years of Education: N/A   Occupational History  . Not on file.   Social History Main Topics  . Smoking status: Never Smoker   . Smokeless tobacco: Never Used  . Alcohol Use: 0.0 oz/week    0 Standard drinks or equivalent per week     Comment: occ  . Drug Use: No  . Sexual Activity: Not on file   Other Topics Concern  . Not on file   Social History Narrative      Review of Systems  All other systems reviewed and are negative.      Objective:   Physical  Exam  Eyes: EOM and lids are normal. Pupils are equal, round, and reactive to light. Lids are everted and swept, no foreign bodies found. Right eye exhibits no discharge and no exudate. No foreign body present in the right eye. Left eye exhibits no discharge and no exudate. No foreign body present in the left eye. Right conjunctiva is not injected. Right conjunctiva has no hemorrhage. Left conjunctiva is injected.  Cardiovascular: Normal rate, regular rhythm and normal heart sounds.   Pulmonary/Chest: Effort normal and breath sounds normal.  Vitals reviewed.         Assessment & Plan:  Chemical insult, eye, left, initial encounter - Plan: erythromycin ophthalmic ointment  painful believe there was a diluted solution and it was appropriately vigorously irrigated quickly enough for any damage was done to his eye. There is no sign of a corneal ulcer today on exam. His vision screening is normal. I will place the patient on erythromycin ophthalmic ointment twice daily until conjunctiva injection resolves. I will also schedule the patient see an ophthalmologist immediately first thing in the morning for more thorough examination of the eye however I see no sign of corneal injury. I believe the patient was very lucky.Marland Kitchen

## 2015-11-03 DIAGNOSIS — F331 Major depressive disorder, recurrent, moderate: Secondary | ICD-10-CM | POA: Diagnosis not present

## 2015-11-16 DIAGNOSIS — F4323 Adjustment disorder with mixed anxiety and depressed mood: Secondary | ICD-10-CM | POA: Diagnosis not present

## 2015-11-30 DIAGNOSIS — F4323 Adjustment disorder with mixed anxiety and depressed mood: Secondary | ICD-10-CM | POA: Diagnosis not present

## 2015-12-13 DIAGNOSIS — N401 Enlarged prostate with lower urinary tract symptoms: Secondary | ICD-10-CM | POA: Diagnosis not present

## 2015-12-17 DIAGNOSIS — D0462 Carcinoma in situ of skin of left upper limb, including shoulder: Secondary | ICD-10-CM | POA: Diagnosis not present

## 2015-12-17 DIAGNOSIS — L814 Other melanin hyperpigmentation: Secondary | ICD-10-CM | POA: Diagnosis not present

## 2015-12-17 DIAGNOSIS — D225 Melanocytic nevi of trunk: Secondary | ICD-10-CM | POA: Diagnosis not present

## 2015-12-17 DIAGNOSIS — C44619 Basal cell carcinoma of skin of left upper limb, including shoulder: Secondary | ICD-10-CM | POA: Diagnosis not present

## 2015-12-17 DIAGNOSIS — L570 Actinic keratosis: Secondary | ICD-10-CM | POA: Diagnosis not present

## 2015-12-17 DIAGNOSIS — L821 Other seborrheic keratosis: Secondary | ICD-10-CM | POA: Diagnosis not present

## 2015-12-17 DIAGNOSIS — D2271 Melanocytic nevi of right lower limb, including hip: Secondary | ICD-10-CM | POA: Diagnosis not present

## 2015-12-17 DIAGNOSIS — Z85828 Personal history of other malignant neoplasm of skin: Secondary | ICD-10-CM | POA: Diagnosis not present

## 2015-12-17 DIAGNOSIS — D485 Neoplasm of uncertain behavior of skin: Secondary | ICD-10-CM | POA: Diagnosis not present

## 2015-12-20 DIAGNOSIS — N401 Enlarged prostate with lower urinary tract symptoms: Secondary | ICD-10-CM | POA: Diagnosis not present

## 2015-12-20 DIAGNOSIS — N138 Other obstructive and reflux uropathy: Secondary | ICD-10-CM | POA: Diagnosis not present

## 2015-12-20 DIAGNOSIS — Z0000 Encounter for general adult medical examination without abnormal findings: Secondary | ICD-10-CM | POA: Diagnosis not present

## 2016-01-13 DIAGNOSIS — J029 Acute pharyngitis, unspecified: Secondary | ICD-10-CM | POA: Diagnosis not present

## 2016-01-13 DIAGNOSIS — J111 Influenza due to unidentified influenza virus with other respiratory manifestations: Secondary | ICD-10-CM | POA: Diagnosis not present

## 2016-01-17 DIAGNOSIS — F4323 Adjustment disorder with mixed anxiety and depressed mood: Secondary | ICD-10-CM | POA: Diagnosis not present

## 2016-01-28 DIAGNOSIS — R05 Cough: Secondary | ICD-10-CM | POA: Diagnosis not present

## 2016-01-28 DIAGNOSIS — J018 Other acute sinusitis: Secondary | ICD-10-CM | POA: Diagnosis not present

## 2016-02-03 DIAGNOSIS — J018 Other acute sinusitis: Secondary | ICD-10-CM | POA: Diagnosis not present

## 2016-02-05 DIAGNOSIS — J069 Acute upper respiratory infection, unspecified: Secondary | ICD-10-CM | POA: Diagnosis not present

## 2016-02-20 DIAGNOSIS — J069 Acute upper respiratory infection, unspecified: Secondary | ICD-10-CM | POA: Diagnosis not present

## 2016-02-25 ENCOUNTER — Encounter: Payer: Self-pay | Admitting: *Deleted

## 2016-03-01 ENCOUNTER — Ambulatory Visit: Payer: Self-pay | Admitting: *Deleted

## 2016-03-01 ENCOUNTER — Ambulatory Visit (INDEPENDENT_AMBULATORY_CARE_PROVIDER_SITE_OTHER): Payer: 59 | Admitting: *Deleted

## 2016-03-01 ENCOUNTER — Ambulatory Visit: Payer: Self-pay

## 2016-03-01 DIAGNOSIS — I781 Nevus, non-neoplastic: Secondary | ICD-10-CM

## 2016-03-01 NOTE — Progress Notes (Signed)
   Cutaneous Laser:pulsed mode  590j/cm2 400 ms delay  10 ms Duration 0.5 spot  Total pulses: 356  Total energy 410  Total time::03  Treated the small red capillaries on his nose, chin and cheeks.Great rxn to the laser. Anticipate good results. Follow prn.

## 2016-03-10 DIAGNOSIS — L570 Actinic keratosis: Secondary | ICD-10-CM | POA: Diagnosis not present

## 2016-03-10 DIAGNOSIS — B078 Other viral warts: Secondary | ICD-10-CM | POA: Diagnosis not present

## 2016-03-21 DIAGNOSIS — F4323 Adjustment disorder with mixed anxiety and depressed mood: Secondary | ICD-10-CM | POA: Diagnosis not present

## 2016-04-05 DIAGNOSIS — F331 Major depressive disorder, recurrent, moderate: Secondary | ICD-10-CM | POA: Diagnosis not present

## 2016-04-05 MED FILL — diazePAM 2 MG TABS: 2 | 15 days supply | Qty: 15 | Fill #0

## 2016-04-05 MED FILL — DULoxetine HCL 60 MG CPEP: 60 | 30 days supply | Qty: 30 | Fill #0

## 2016-04-05 MED FILL — OMEPRAZOLE DR 20 MG CAPSULE: 20 | 30 days supply | Qty: 30 | Fill #0

## 2016-04-05 MED FILL — DULoxetine HCL 30 MG CPEP: 30 | 28 days supply | Qty: 49 | Fill #0

## 2016-04-10 DIAGNOSIS — F4323 Adjustment disorder with mixed anxiety and depressed mood: Secondary | ICD-10-CM | POA: Diagnosis not present

## 2016-04-11 DIAGNOSIS — F4323 Adjustment disorder with mixed anxiety and depressed mood: Secondary | ICD-10-CM | POA: Diagnosis not present

## 2016-05-08 DIAGNOSIS — F331 Major depressive disorder, recurrent, moderate: Secondary | ICD-10-CM | POA: Diagnosis not present

## 2016-05-09 ENCOUNTER — Other Ambulatory Visit: Payer: Self-pay | Admitting: Family Medicine

## 2016-05-09 ENCOUNTER — Other Ambulatory Visit: Payer: 59

## 2016-05-09 DIAGNOSIS — Z125 Encounter for screening for malignant neoplasm of prostate: Secondary | ICD-10-CM | POA: Diagnosis not present

## 2016-05-09 DIAGNOSIS — Z0000 Encounter for general adult medical examination without abnormal findings: Secondary | ICD-10-CM

## 2016-05-09 LAB — TSH: TSH: 2.7 m[IU]/L (ref 0.40–4.50)

## 2016-05-09 LAB — LIPID PANEL
CHOLESTEROL: 195 mg/dL (ref 125–200)
HDL: 74 mg/dL (ref >=40)
LDL Cholesterol: 107 mg/dL (ref <130)
TRIGLYCERIDES: 70 mg/dL (ref <150)
Total CHOL/HDL Ratio: 2.6 Ratio (ref <=5.0)
VLDL: 14 mg/dL (ref <30)

## 2016-05-09 LAB — CBC WITH DIFFERENTIAL/PLATELET
BASOS ABS: 42 {cells}/uL (ref 0–200)
BASOS PCT: 1 %
EOS ABS: 84 {cells}/uL (ref 15–500)
Eosinophils Relative: 2 %
HEMATOCRIT: 42.8 % (ref 38.5–50.0)
HEMOGLOBIN: 14.8 g/dL (ref 13.0–17.0)
LYMPHS ABS: 1218 {cells}/uL (ref 850–3900)
Lymphocytes Relative: 29 %
MCH: 30.8 pg (ref 27.0–33.0)
MCHC: 34.6 g/dL (ref 32.0–36.0)
MCV: 89 fL (ref 80.0–100.0)
MONO ABS: 294 {cells}/uL (ref 200–950)
MPV: 9.5 fL (ref 7.5–12.5)
Monocytes Relative: 7 %
NEUTROS ABS: 2562 {cells}/uL (ref 1500–7800)
Neutrophils Relative %: 61 %
PLATELETS: 213 10*3/uL (ref 140–400)
RBC: 4.81 MIL/uL (ref 4.20–5.80)
RDW: 13.5 % (ref 11.0–15.0)
WBC: 4.2 10*3/uL (ref 3.8–10.8)

## 2016-05-09 LAB — COMPLETE METABOLIC PANEL WITH GFR
ALT: 29 U/L (ref 9–46)
AST: 31 U/L (ref 10–35)
Albumin: 4.3 g/dL (ref 3.6–5.1)
Alkaline Phosphatase: 42 U/L (ref 40–115)
BILIRUBIN TOTAL: 0.8 mg/dL (ref 0.2–1.2)
BUN: 15 mg/dL (ref 7–25)
CALCIUM: 9 mg/dL (ref 8.6–10.3)
CO2: 29 mmol/L (ref 20–31)
CREATININE: 1.22 mg/dL (ref 0.70–1.33)
Chloride: 102 mmol/L (ref 98–110)
GFR, EST AFRICAN AMERICAN: 77 mL/min (ref >=60)
GFR, EST NON AFRICAN AMERICAN: 67 mL/min (ref >=60)
Glucose, Bld: 94 mg/dL (ref 70–99)
Potassium: 4.2 mmol/L (ref 3.5–5.3)
Sodium: 142 mmol/L (ref 135–146)
TOTAL PROTEIN: 6.5 g/dL (ref 6.1–8.1)

## 2016-05-10 LAB — PSA: PSA: 0.45 ng/mL (ref <=4.00)

## 2016-05-10 MED FILL — DULoxetine HCL 60 MG CPEP: 60 | 30 days supply | Qty: 30 | Fill #0

## 2016-05-10 MED FILL — HYDROXYZINE PAM 50 MG CAP: 50 | 30 days supply | Qty: 60 | Fill #0

## 2016-05-16 ENCOUNTER — Encounter: Payer: Self-pay | Admitting: Family Medicine

## 2016-05-16 ENCOUNTER — Ambulatory Visit (INDEPENDENT_AMBULATORY_CARE_PROVIDER_SITE_OTHER): Payer: 59 | Admitting: Family Medicine

## 2016-05-16 VITALS — BP 136/94 | Temp 97.5°F | Wt 195.0 lb

## 2016-05-16 DIAGNOSIS — Z0000 Encounter for general adult medical examination without abnormal findings: Secondary | ICD-10-CM | POA: Diagnosis not present

## 2016-05-16 NOTE — Progress Notes (Signed)
Subjective:    Patient ID: Alex Caldwell, male    DOB: 10/09/1961, 55 y.o.   MRN: [id number]  HPI Patient is here today for a complete physical exam. He is due for a colonoscopy. His prostate is checked every April by a urologist. His blood pressure today is slightly elevated. He attributes this to marital stress that he is currently undergoing. 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 Otherwise he is doing well with no concerns. His most recent lab work as listed below: Appointment on 05/09/2016  Component Date Value Ref Range Status  . Sodium 05/09/2016 142  135 - 146 mmol/L Final  . Potassium 05/09/2016 4.2  3.5 - 5.3 mmol/L Final  . Chloride 05/09/2016 102  98 - 110 mmol/L Final  . CO2 05/09/2016 29  20 - 31 mmol/L Final  . Glucose, Bld 05/09/2016 94  70 - 99 mg/dL Final  . BUN 05/09/2016 15  7 - 25 mg/dL Final  . Creat 05/09/2016 1.22  0.70 - 1.33 mg/dL Final   Comment:   For patients > or = 55 years of age: The upper reference limit for Creatinine is approximately 13% higher for people identified as African-American.     . Total Bilirubin 05/09/2016 0.8  0.2 - 1.2 mg/dL Final  . Alkaline Phosphatase 05/09/2016 42  40 - 115 U/L Final  . AST 05/09/2016 31  10 - 35 U/L Final  . ALT 05/09/2016 29  9 - 46 U/L Final  . Total Protein 05/09/2016 6.5  6.1 - 8.1 g/dL Final  . Albumin 05/09/2016 4.3  3.6 - 5.1 g/dL Final  . Calcium 05/09/2016 9.0  8.6 - 10.3 mg/dL Final  . GFR, Est African American 05/09/2016 77  >=60 mL/min Final  . GFR, Est Non African American 05/09/2016 67  >=60 mL/min Final  . TSH 05/09/2016 2.70  0.40 - 4.50 mIU/L Final  . Cholesterol 05/09/2016 195  125 - 200 mg/dL Final  . Triglycerides 05/09/2016 70  <150 mg/dL Final  . HDL 05/09/2016 74  >=40 mg/dL Final  . Total CHOL/HDL Ratio 05/09/2016 2.6  <=5.0 Ratio Final  . VLDL 05/09/2016 14  <30 mg/dL Final  . LDL  Cholesterol 05/09/2016 107  <130 mg/dL Final   Comment:   Total Cholesterol/HDL Ratio:CHD Risk                        Coronary Heart Disease Risk Table                                        Men       Women          1/2 Average Risk              3.4        3.3              Average Risk              5.0        4.4           2X Average Risk              9.6        7.1  3X Average Risk             23.4       11.0 Use the calculated Patient Ratio above and the CHD Risk table  to determine the patient's CHD Risk.   . WBC 05/09/2016 4.2  3.8 - 10.8 K/uL Final  . RBC 05/09/2016 4.81  4.20 - 5.80 MIL/uL Final  . Hemoglobin 05/09/2016 14.8  13.0 - 17.0 g/dL Final  . HCT 05/09/2016 42.8  38.5 - 50.0 % Final  . MCV 05/09/2016 89.0  80.0 - 100.0 fL Final  . MCH 05/09/2016 30.8  27.0 - 33.0 pg Final  . MCHC 05/09/2016 34.6  32.0 - 36.0 g/dL Final  . RDW 05/09/2016 13.5  11.0 - 15.0 % Final  . Platelets 05/09/2016 213  140 - 400 K/uL Final  . MPV 05/09/2016 9.5  7.5 - 12.5 fL Final  . Neutro Abs 05/09/2016 2562  1500 - 7800 cells/uL Final  . Lymphs Abs 05/09/2016 1218  850 - 3900 cells/uL Final  . Monocytes Absolute 05/09/2016 294  200 - 950 cells/uL Final  . Eosinophils Absolute 05/09/2016 84  15 - 500 cells/uL Final  . Basophils Absolute 05/09/2016 42  0 - 200 cells/uL Final  . Neutrophils Relative % 05/09/2016 61   Final  . Lymphocytes Relative 05/09/2016 29   Final  . Monocytes Relative 05/09/2016 7   Final  . Eosinophils Relative 05/09/2016 2   Final  . Basophils Relative 05/09/2016 1   Final  . Smear Review 05/09/2016 Criteria for review not met   Final   ** Please note change in unit of measure and reference range(s). **  . PSA 05/09/2016 0.45  <=4.00 ng/mL Final   Comment: Test Methodology: ECLIA PSA (Electrochemiluminescence Immunoassay)   For PSA values from 2.5-4.0, particularly in younger men <45 years old, the AUA and NCCN suggest testing for % Free PSA (3515)  and evaluation of the rate of increase in PSA (PSA velocity).    Past Medical History  Diagnosis Date  . No pertinent past medical history   . DJD (degenerative joint disease)   . Rotator cuff tear, left   . Acromioclavicular joint arthritis     Left shoulder   Past Surgical History  Procedure Laterality Date  . Knee arthroscopy w/ acl reconstruction  1995    left  . Biceps tendon repair  2004    left   No current outpatient prescriptions on file prior to visit.   No current facility-administered medications on file prior to visit.   No Known Allergies Social History   Social History  . Marital Status: Married    Spouse Name: N/A  . Number of Children: N/A  . Years of Education: N/A   Occupational History  . Not on file.   Social History Main Topics  . Smoking status: Never Smoker   . Smokeless tobacco: Never Used  . Alcohol Use: 0.0 oz/week    0 Standard drinks or equivalent per week     Comment: occ  . Drug Use: No  . Sexual Activity: Not on file   Other Topics Concern  . Not on file   Social History Narrative   Family History  Problem Relation Age of Onset  . Cancer Mother     ovarian  . Hyperlipidemia Father   . Hypertension Father       Review of Systems  All other systems reviewed and are negative.      Objective:  Physical Exam  Constitutional: He is oriented to person, place, and time. He appears well-developed and well-nourished. No distress.  HENT:  Head: Normocephalic and atraumatic.  Right Ear: External ear normal.  Left Ear: External ear normal.  Nose: Nose normal.  Mouth/Throat: Oropharynx is clear and moist. No oropharyngeal exudate.  Eyes: Conjunctivae and EOM are normal. Pupils are equal, round, and reactive to light. Right eye exhibits no discharge. Left eye exhibits no discharge. No scleral icterus.  Neck: Normal range of motion. Neck supple. No JVD present. No tracheal deviation present. No thyromegaly present.   Cardiovascular: Normal rate, regular rhythm, normal heart sounds and intact distal pulses.  Exam reveals no gallop and no friction rub.   No murmur heard. Pulmonary/Chest: Effort normal and breath sounds normal. No stridor. No respiratory distress. He has no wheezes. He has no rales. He exhibits no tenderness.  Abdominal: Soft. Bowel sounds are normal. He exhibits no distension and no mass. There is no tenderness. There is no rebound and no guarding.  Musculoskeletal: Normal range of motion. He exhibits no edema or tenderness.  Lymphadenopathy:    He has no cervical adenopathy.  Neurological: He is alert and oriented to person, place, and time. He has normal reflexes. He displays normal reflexes. No cranial nerve deficit. He exhibits normal muscle tone. Coordination normal.  Skin: Skin is warm. No rash noted. He is not diaphoretic. No erythema. No pallor.  Psychiatric: He has a normal mood and affect. His behavior is normal. Judgment and thought content normal.  Vitals reviewed.         Assessment & Plan:  Routine general medical examination at a health care facility  Physical exam today is significant for elevated blood pressure. He will check his blood pressure everyday for the next 2-3 weeks and report the values to me. If consistently greater than 140/90, I'll start the patient on losartan. The remainder of his lab work is excellent. I will schedule the patient for colonoscopy. His immunizations are up-to-date.

## 2016-05-23 ENCOUNTER — Ambulatory Visit (INDEPENDENT_AMBULATORY_CARE_PROVIDER_SITE_OTHER): Payer: 59 | Admitting: *Deleted

## 2016-05-23 ENCOUNTER — Encounter: Payer: Self-pay | Admitting: Family Medicine

## 2016-05-23 VITALS — BP 138/74 | HR 60 | Resp 16 | Ht 74.0 in | Wt 194.0 lb

## 2016-05-23 DIAGNOSIS — Z136 Encounter for screening for cardiovascular disorders: Secondary | ICD-10-CM

## 2016-05-23 DIAGNOSIS — Z0130 Encounter for examination of blood pressure without abnormal findings: Secondary | ICD-10-CM

## 2016-05-23 NOTE — Progress Notes (Signed)
  Patient seen in office to have BP checked.   Reports that he was advised to buy monitor and record readings x1 week at his CPE. States that he bought wrist cuff and is noting that BP has been elevated (150's/ 90's).  States that he has also been checking BP with his fathers automatic arm cuff. Reports that BP have been more moderate while checking with arm cuff (130's/ 80's).   Manually checked BP in L arm and noted to be WNL. MD to be made aware.

## 2016-05-29 DIAGNOSIS — F4323 Adjustment disorder with mixed anxiety and depressed mood: Secondary | ICD-10-CM | POA: Diagnosis not present

## 2016-06-08 ENCOUNTER — Telehealth: Payer: Self-pay | Admitting: *Deleted

## 2016-06-08 NOTE — Telephone Encounter (Signed)
Received BP readings from patient.   MD reviewed and states that BP readings are excellent.   No changes will be made.   Call placed to patient and patient made aware.

## 2016-06-16 ENCOUNTER — Encounter: Payer: Self-pay | Admitting: Family Medicine

## 2016-06-26 DIAGNOSIS — F4323 Adjustment disorder with mixed anxiety and depressed mood: Secondary | ICD-10-CM | POA: Diagnosis not present

## 2016-06-27 DIAGNOSIS — F331 Major depressive disorder, recurrent, moderate: Secondary | ICD-10-CM | POA: Diagnosis not present

## 2016-07-11 MED FILL — DULoxetine HCL 60 MG CPEP: 60 | 30 days supply | Qty: 30 | Fill #0

## 2016-07-26 MED FILL — HYDROXYZINE PAM 50 MG CAP: 50 | 30 days supply | Qty: 60 | Fill #1

## 2016-08-07 DIAGNOSIS — F4323 Adjustment disorder with mixed anxiety and depressed mood: Secondary | ICD-10-CM | POA: Diagnosis not present

## 2016-08-14 DIAGNOSIS — B078 Other viral warts: Secondary | ICD-10-CM | POA: Diagnosis not present

## 2016-08-14 DIAGNOSIS — Z85828 Personal history of other malignant neoplasm of skin: Secondary | ICD-10-CM | POA: Diagnosis not present

## 2016-08-14 DIAGNOSIS — L570 Actinic keratosis: Secondary | ICD-10-CM | POA: Diagnosis not present

## 2016-08-14 DIAGNOSIS — L814 Other melanin hyperpigmentation: Secondary | ICD-10-CM | POA: Diagnosis not present

## 2016-08-22 DIAGNOSIS — F331 Major depressive disorder, recurrent, moderate: Secondary | ICD-10-CM | POA: Diagnosis not present

## 2016-08-25 MED FILL — DULoxetine HCL 60 MG CPEP: 60 | 30 days supply | Qty: 30 | Fill #0

## 2016-08-25 MED FILL — HYDROXYZINE PAM 50 MG CAP: 50 | 30 days supply | Qty: 60 | Fill #0

## 2016-09-04 DIAGNOSIS — F4323 Adjustment disorder with mixed anxiety and depressed mood: Secondary | ICD-10-CM | POA: Diagnosis not present

## 2016-09-26 MED FILL — HYDROXYZINE PAM 50 MG CAP: 50 | 30 days supply | Qty: 60 | Fill #1

## 2016-09-27 DIAGNOSIS — H6090 Unspecified otitis externa, unspecified ear: Secondary | ICD-10-CM | POA: Diagnosis not present

## 2016-10-10 MED FILL — DULoxetine HCL 60 MG CPEP: 60 | 30 days supply | Qty: 30 | Fill #1

## 2016-10-20 DIAGNOSIS — H6090 Unspecified otitis externa, unspecified ear: Secondary | ICD-10-CM | POA: Diagnosis not present

## 2016-10-31 MED FILL — HYDROXYZINE PAM 50 MG CAP: 50 | 30 days supply | Qty: 60 | Fill #2

## 2016-11-06 DIAGNOSIS — H60392 Other infective otitis externa, left ear: Secondary | ICD-10-CM | POA: Diagnosis not present

## 2016-11-06 DIAGNOSIS — B0089 Other herpesviral infection: Secondary | ICD-10-CM | POA: Diagnosis not present

## 2016-11-06 MED FILL — AMOX-CLAV 875-125 MG TABLET: 875-125 | 10 days supply | Qty: 20 | Fill #0

## 2016-11-06 MED FILL — predniSONE 10 MG TABS: 10 | 6 days supply | Qty: 21 | Fill #0

## 2016-11-06 MED FILL — ACYCLOVIR 400 MG TABLET: 400 | 7 days supply | Qty: 21 | Fill #0

## 2016-11-20 DIAGNOSIS — D0461 Carcinoma in situ of skin of right upper limb, including shoulder: Secondary | ICD-10-CM | POA: Diagnosis not present

## 2016-11-20 DIAGNOSIS — B078 Other viral warts: Secondary | ICD-10-CM | POA: Diagnosis not present

## 2016-11-20 DIAGNOSIS — B079 Viral wart, unspecified: Secondary | ICD-10-CM | POA: Diagnosis not present

## 2016-11-20 DIAGNOSIS — Z85828 Personal history of other malignant neoplasm of skin: Secondary | ICD-10-CM | POA: Diagnosis not present

## 2016-11-20 DIAGNOSIS — L570 Actinic keratosis: Secondary | ICD-10-CM | POA: Diagnosis not present

## 2016-11-20 MED FILL — FLUOROURACIL 5% CREAM: 5 | 14 days supply | Qty: 40 | Fill #0

## 2016-11-28 DIAGNOSIS — H60392 Other infective otitis externa, left ear: Secondary | ICD-10-CM | POA: Diagnosis not present

## 2016-12-05 MED FILL — HYDROXYZINE PAM 50 MG CAP: 50 | 30 days supply | Qty: 60 | Fill #0

## 2016-12-06 DIAGNOSIS — F331 Major depressive disorder, recurrent, moderate: Secondary | ICD-10-CM | POA: Diagnosis not present

## 2016-12-08 MED FILL — DULoxetine HCL 60 MG CPEP: 60 | 30 days supply | Qty: 30 | Fill #0

## 2016-12-19 DIAGNOSIS — N401 Enlarged prostate with lower urinary tract symptoms: Secondary | ICD-10-CM | POA: Diagnosis not present

## 2016-12-26 DIAGNOSIS — N401 Enlarged prostate with lower urinary tract symptoms: Secondary | ICD-10-CM | POA: Diagnosis not present

## 2016-12-26 DIAGNOSIS — R350 Frequency of micturition: Secondary | ICD-10-CM | POA: Diagnosis not present

## 2016-12-26 DIAGNOSIS — I861 Scrotal varices: Secondary | ICD-10-CM | POA: Diagnosis not present

## 2016-12-26 DIAGNOSIS — N5201 Erectile dysfunction due to arterial insufficiency: Secondary | ICD-10-CM | POA: Diagnosis not present

## 2016-12-26 DIAGNOSIS — A6000 Herpesviral infection of urogenital system, unspecified: Secondary | ICD-10-CM | POA: Diagnosis not present

## 2016-12-26 MED FILL — ACYCLOVIR 400 MG TABLET: 400 | 90 days supply | Qty: 180 | Fill #0

## 2017-01-10 DIAGNOSIS — B0089 Other herpesviral infection: Secondary | ICD-10-CM | POA: Diagnosis not present

## 2017-02-05 ENCOUNTER — Encounter: Payer: Self-pay | Admitting: *Deleted

## 2017-02-05 MED FILL — HYDROXYZINE PAM 50 MG CAP: 50 | 30 days supply | Qty: 60 | Fill #0

## 2017-02-12 DIAGNOSIS — L814 Other melanin hyperpigmentation: Secondary | ICD-10-CM | POA: Diagnosis not present

## 2017-02-12 DIAGNOSIS — Z85828 Personal history of other malignant neoplasm of skin: Secondary | ICD-10-CM | POA: Diagnosis not present

## 2017-02-12 DIAGNOSIS — L570 Actinic keratosis: Secondary | ICD-10-CM | POA: Diagnosis not present

## 2017-02-12 DIAGNOSIS — L578 Other skin changes due to chronic exposure to nonionizing radiation: Secondary | ICD-10-CM | POA: Diagnosis not present

## 2017-02-15 ENCOUNTER — Ambulatory Visit: Payer: 59 | Admitting: *Deleted

## 2017-02-22 ENCOUNTER — Encounter: Payer: Self-pay | Admitting: *Deleted

## 2017-03-05 MED FILL — ACYCLOVIR 400 MG TABLET: 400 | 5 days supply | Qty: 10 | Fill #1

## 2017-03-05 MED FILL — HYDROXYZINE PAM 50 MG CAP: 50 | 30 days supply | Qty: 60 | Fill #1

## 2017-03-07 ENCOUNTER — Ambulatory Visit: Payer: 59 | Admitting: *Deleted

## 2017-04-03 DIAGNOSIS — F331 Major depressive disorder, recurrent, moderate: Secondary | ICD-10-CM | POA: Diagnosis not present

## 2017-04-04 MED FILL — HYDROXYZINE PAM 50 MG CAP: 50 | 30 days supply | Qty: 60 | Fill #0

## 2017-04-04 MED FILL — DULoxetine HCL 60 MG CPEP: 60 | 30 days supply | Qty: 30 | Fill #0

## 2017-04-09 MED FILL — ACYCLOVIR 400 MG TABLET: 400 | 90 days supply | Qty: 180 | Fill #2

## 2017-05-09 MED FILL — HYDROXYZINE PAM 50 MG CAP: 50 | 30 days supply | Qty: 60 | Fill #1

## 2017-06-06 MED FILL — HYDROXYZINE PAM 50 MG CAP: 50 | 30 days supply | Qty: 60 | Fill #2

## 2017-06-12 DIAGNOSIS — A6000 Herpesviral infection of urogenital system, unspecified: Secondary | ICD-10-CM | POA: Diagnosis not present

## 2017-07-09 MED FILL — HYDROXYZINE PAM 50 MG CAP: 50 | 30 days supply | Qty: 60 | Fill #3

## 2017-08-09 MED FILL — HYDROXYZINE PAM 50 MG CAP: 50 | 30 days supply | Qty: 60 | Fill #2

## 2017-08-13 DIAGNOSIS — L218 Other seborrheic dermatitis: Secondary | ICD-10-CM | POA: Diagnosis not present

## 2017-08-13 DIAGNOSIS — L570 Actinic keratosis: Secondary | ICD-10-CM | POA: Diagnosis not present

## 2017-08-13 DIAGNOSIS — Z85828 Personal history of other malignant neoplasm of skin: Secondary | ICD-10-CM | POA: Diagnosis not present

## 2017-08-13 DIAGNOSIS — L814 Other melanin hyperpigmentation: Secondary | ICD-10-CM | POA: Diagnosis not present

## 2017-08-13 DIAGNOSIS — D692 Other nonthrombocytopenic purpura: Secondary | ICD-10-CM | POA: Diagnosis not present

## 2017-08-13 DIAGNOSIS — B078 Other viral warts: Secondary | ICD-10-CM | POA: Diagnosis not present

## 2017-08-13 DIAGNOSIS — D0462 Carcinoma in situ of skin of left upper limb, including shoulder: Secondary | ICD-10-CM | POA: Diagnosis not present

## 2017-08-13 MED FILL — FLUOCINONIDE 0.05% SOLUTION: 0.05 | 30 days supply | Qty: 60 | Fill #0

## 2017-08-14 DIAGNOSIS — F331 Major depressive disorder, recurrent, moderate: Secondary | ICD-10-CM | POA: Diagnosis not present

## 2017-08-15 MED FILL — DULoxetine HCL 60 MG CPEP: 60 | 90 days supply | Qty: 90 | Fill #0

## 2017-09-04 MED FILL — ACYCLOVIR 400 MG TABLET: 400 | 90 days supply | Qty: 180 | Fill #3

## 2017-09-04 MED FILL — HYDROXYZINE PAM 50 MG CAP: 50 | 30 days supply | Qty: 60 | Fill #3

## 2017-10-04 MED FILL — HYDROXYZINE PAM 50 MG CAP: 50 | 90 days supply | Qty: 180 | Fill #0

## 2017-12-28 MED FILL — ACYCLOVIR 400 MG TABLET: 400 | 7 days supply | Qty: 21 | Fill #0

## 2018-01-08 MED FILL — ACYCLOVIR 400 MG TABLET: 400 | 30 days supply | Qty: 60 | Fill #0

## 2018-01-08 MED FILL — HYDROXYZINE PAM 50 MG CAP: 50 | 90 days supply | Qty: 180 | Fill #1

## 2018-02-07 ENCOUNTER — Encounter: Payer: Self-pay | Admitting: Gastroenterology

## 2018-02-07 ENCOUNTER — Telehealth: Payer: Self-pay | Admitting: Family Medicine

## 2018-02-07 DIAGNOSIS — Z1211 Encounter for screening for malignant neoplasm of colon: Secondary | ICD-10-CM

## 2018-02-07 NOTE — Telephone Encounter (Signed)
Patient is calling because he had cpe done, and at the time a colonscopy was mentioned but he never went to have one done, he would like to have that scheduled if possible [phone]

## 2018-02-07 NOTE — Telephone Encounter (Signed)
Ok to place GI referral. 

## 2018-02-07 NOTE — Telephone Encounter (Signed)
Ok with referral, last physical with me was 2017.

## 2018-02-07 NOTE — Telephone Encounter (Signed)
Referral placed in Epic.

## 2018-03-18 ENCOUNTER — Ambulatory Visit (AMBULATORY_SURGERY_CENTER): Payer: Self-pay | Admitting: *Deleted

## 2018-03-18 ENCOUNTER — Other Ambulatory Visit: Payer: Self-pay

## 2018-03-18 VITALS — Ht 74.0 in | Wt 196.0 lb

## 2018-03-18 DIAGNOSIS — Z1211 Encounter for screening for malignant neoplasm of colon: Secondary | ICD-10-CM

## 2018-03-18 MED ORDER — NA SULFATE-K SULFATE-MG SULF 17.5-3.13-1.6 GM/177ML PO SOLN: ORAL | 0 refills | Status: DC

## 2018-03-18 MED FILL — SUPREP BOWEL PREP KIT: 17.5-3.13-1 | 1 days supply | Qty: 354 | Fill #0

## 2018-03-18 NOTE — Progress Notes (Signed)
Patient denies any allergies to eggs or soy. Patient denies any problems with anesthesia/sedation. Patient denies any oxygen use at home. Patient denies taking any diet/weight loss medications or blood thinners. EMMI education declined by the pt.  

## 2018-03-19 ENCOUNTER — Encounter: Payer: Self-pay | Admitting: Gastroenterology

## 2018-03-20 ENCOUNTER — Telehealth: Payer: Self-pay | Admitting: Gastroenterology

## 2018-03-20 MED FILL — ACYCLOVIR 400 MG TABLET: 400 | 30 days supply | Qty: 60 | Fill #1

## 2018-03-20 NOTE — Telephone Encounter (Signed)
Pt asking if he can have a light breakfast since his case is an afternoon case - his wife is coming at 2 pm and she can but his instructions did not say that- Told him yes he can eat eggs and toast or chic noodle soup and crackers before 9 am the day before but NO SOLIDS after 9 am- he asked if he can have Gatorade as long as not red or purple- told yes-  He asked if he can have pnut butter protein bars with no nuts- told him yes the 5 days before but NO nuts or seeds   Lelan Pons PV

## 2018-03-26 ENCOUNTER — Encounter: Payer: Self-pay | Admitting: Gastroenterology

## 2018-03-26 ENCOUNTER — Ambulatory Visit (AMBULATORY_SURGERY_CENTER): Payer: 59 | Admitting: Gastroenterology

## 2018-03-26 VITALS — BP 98/64 | HR 50 | Temp 98.9°F | Resp 11 | Ht 74.0 in | Wt 196.0 lb

## 2018-03-26 DIAGNOSIS — Z1211 Encounter for screening for malignant neoplasm of colon: Secondary | ICD-10-CM | POA: Diagnosis present

## 2018-03-26 DIAGNOSIS — F329 Major depressive disorder, single episode, unspecified: Secondary | ICD-10-CM | POA: Diagnosis not present

## 2018-03-26 MED ORDER — SODIUM CHLORIDE 0.9 % IV SOLN: 500.0000 mL | Freq: Once | INTRAVENOUS | Status: DC

## 2018-03-26 NOTE — Progress Notes (Signed)
Pt's states no medical or surgical changes since previsit or office visit. 

## 2018-03-26 NOTE — Op Note (Signed)
[place] Patient Name: Alex Caldwell Procedure Date: 03/26/2018 1:35 PM MRN: [id number] Endoscopist: Ladene Artist , MD Age: 57 Referring MD:  Date of Birth: February 02, 1961 Gender: Male Account #: [id number] Procedure:                Colonoscopy Indications:              Screening for colorectal malignant neoplasm Medicines:                Monitored Anesthesia Care Procedure:                Pre-Anesthesia Assessment:                           - Prior to the procedure, a History and Physical                            was performed, and patient medications and                            allergies were reviewed. The patient's tolerance of                            previous anesthesia was also reviewed. The risks                            and benefits of the procedure and the sedation                            options and risks were discussed with the patient.                            All questions were answered, and informed consent                            was obtained. Prior Anticoagulants: The patient has                            taken no previous anticoagulant or antiplatelet                            agents. ASA Grade Assessment: II - A patient with                            mild systemic disease. After reviewing the risks                            and benefits, the patient was deemed in                            satisfactory condition to undergo the procedure.                           After obtaining informed consent, the colonoscope  was passed under direct vision. Throughout the                            procedure, the patient's blood pressure, pulse, and                            oxygen saturations were monitored continuously. The                            Colonoscope was introduced through the anus and                            advanced to the the cecum, identified by                            appendiceal orifice and  ileocecal valve. The                            ileocecal valve, appendiceal orifice, and rectum                            were photographed. The quality of the bowel                            preparation was good. The colonoscopy was performed                            without difficulty. The patient tolerated the                            procedure well. Scope In: 1:42:56 PM Scope Out: 1:53:34 PM Scope Withdrawal Time: 0 hours 8 minutes 3 seconds  Total Procedure Duration: 0 hours 10 minutes 38 seconds  Findings:                 The perianal and digital rectal examinations were                            normal.                           Internal hemorrhoids were found during                            retroflexion. The hemorrhoids were small and Grade                            I (internal hemorrhoids that do not prolapse).                           The exam was otherwise without abnormality on                            direct and retroflexion views. Complications:            No immediate complications. Estimated blood loss:  None. Estimated Blood Loss:     Estimated blood loss: none. Impression:               - Internal hemorrhoids.                           - The examination was otherwise normal on direct                            and retroflexion views.                           - No specimens collected. Recommendation:           - Repeat colonoscopy in 10 years for screening                            purposes.                           - Patient has a contact number available for                            emergencies. The signs and symptoms of potential                            delayed complications were discussed with the                            patient. Return to normal activities tomorrow.                            Written discharge instructions were provided to the                            patient.                           - Resume  previous diet.                           - Continue present medications. Ladene Artist, MD 03/26/2018 1:56:02 PM This report has been signed electronically.

## 2018-03-26 NOTE — Patient Instructions (Signed)
Handout given : Hemorrhoids.  YOU HAD AN ENDOSCOPIC PROCEDURE TODAY AT [facility]:   Refer to the procedure report that was given to you for any specific questions about what was found during the examination.  If the procedure report does not answer your questions, please call your gastroenterologist to clarify.  If you requested that your care partner not be given the details of your procedure findings, then the procedure report has been included in a sealed envelope for you to review at your convenience later.  YOU SHOULD EXPECT: Some feelings of bloating in the abdomen. Passage of more gas than usual.  Walking can help get rid of the air that was put into your GI tract during the procedure and reduce the bloating. If you had a lower endoscopy (such as a colonoscopy or flexible sigmoidoscopy) you may notice spotting of blood in your stool or on the toilet paper. If you underwent a bowel prep for your procedure, you may not have a normal bowel movement for a few days.  Please Note:  You might notice some irritation and congestion in your nose or some drainage.  This is from the oxygen used during your procedure.  There is no need for concern and it should clear up in a day or so.  SYMPTOMS TO REPORT IMMEDIATELY:   Following lower endoscopy (colonoscopy or flexible sigmoidoscopy):  Excessive amounts of blood in the stool  Significant tenderness or worsening of abdominal pains  Swelling of the abdomen that is new, acute  Fever of 100F or higher   For urgent or emergent issues, a gastroenterologist can be reached at any hour by calling [phone].   DIET:  We do recommend a small meal at first, but then you may proceed to your regular diet.  Drink plenty of fluids but you should avoid alcoholic beverages for 24 hours.  ACTIVITY:  You should plan to take it easy for the rest of today and you should NOT DRIVE or use heavy machinery until tomorrow (because of the sedation  medicines used during the test).    FOLLOW UP: Our staff will call the number listed on your records the next business day following your procedure to check on you and address any questions or concerns that you may have regarding the information given to you following your procedure. If we do not reach you, we will leave a message.  However, if you are feeling well and you are not experiencing any problems, there is no need to return our call.  We will assume that you have returned to your regular daily activities without incident.  If any biopsies were taken you will be contacted by phone or by letter within the next 1-3 weeks.  Please call us at [phone] if you have not heard about the biopsies in 3 weeks.    SIGNATURES/CONFIDENTIALITY: You and/or your care partner have signed paperwork which will be entered into your electronic medical record.  These signatures attest to the fact that that the information above on your After Visit Summary has been reviewed and is understood.  Full responsibility of the confidentiality of this discharge information lies with you and/or your care-partner.

## 2018-03-26 NOTE — Progress Notes (Signed)
Report to PACU, RN, vss, BBS= Clear.  

## 2018-03-27 ENCOUNTER — Telehealth: Payer: Self-pay

## 2018-03-27 NOTE — Telephone Encounter (Signed)
  Follow up Call-  Call back number 03/26/2018  Post procedure Call Back phone  # [phone]  Permission to leave phone message Yes  Some recent data might be hidden     Patient questions:  Do you have a fever, pain , or abdominal swelling? No. Pain Score  0 *  Have you tolerated food without any problems? Yes.    Have you been able to return to your normal activities? Yes.    Do you have any questions about your discharge instructions: Diet   No. Medications  No. Follow up visit  No.  Do you have questions or concerns about your Care? No.  Actions: * If pain score is 4 or above: No action needed, pain <4.  No problems noted per pt. maw

## 2018-03-27 NOTE — Telephone Encounter (Signed)
Called [phone] and left a messaged we tried to reach pt for a follow up call. maw

## 2018-04-02 ENCOUNTER — Other Ambulatory Visit: Payer: Self-pay | Admitting: Family Medicine

## 2018-04-02 DIAGNOSIS — Z0000 Encounter for general adult medical examination without abnormal findings: Secondary | ICD-10-CM

## 2018-04-02 DIAGNOSIS — Z125 Encounter for screening for malignant neoplasm of prostate: Secondary | ICD-10-CM

## 2018-04-03 ENCOUNTER — Other Ambulatory Visit: Payer: 59

## 2018-04-03 DIAGNOSIS — Z0000 Encounter for general adult medical examination without abnormal findings: Secondary | ICD-10-CM

## 2018-04-03 DIAGNOSIS — Z125 Encounter for screening for malignant neoplasm of prostate: Secondary | ICD-10-CM

## 2018-04-04 LAB — LIPID PANEL
CHOL/HDL RATIO: 3.3 (calc) (ref <5.0)
CHOLESTEROL: 195 mg/dL (ref <200)
HDL: 60 mg/dL (ref >40)
LDL Cholesterol (Calc): 120 mg/dL (calc) — ABNORMAL HIGH
NON-HDL CHOLESTEROL (CALC): 135 mg/dL — AB (ref <130)
TRIGLYCERIDES: 63 mg/dL (ref <150)

## 2018-04-04 LAB — CBC WITH DIFFERENTIAL/PLATELET
Basophils Absolute: 22 cells/uL (ref 0–200)
Basophils Relative: 0.5 %
EOS PCT: 2.8 %
Eosinophils Absolute: 120 cells/uL (ref 15–500)
HCT: 42.4 % (ref 38.5–50.0)
Hemoglobin: 14.6 g/dL (ref 13.2–17.1)
Lymphs Abs: 1359 cells/uL (ref 850–3900)
MCH: 29.3 pg (ref 27.0–33.0)
MCHC: 34.4 g/dL (ref 32.0–36.0)
MCV: 85.1 fL (ref 80.0–100.0)
MPV: 9.7 fL (ref 7.5–12.5)
Monocytes Relative: 8.8 %
NEUTROS PCT: 56.3 %
Neutro Abs: 2421 cells/uL (ref 1500–7800)
Platelets: 208 10*3/uL (ref 140–400)
RBC: 4.98 10*6/uL (ref 4.20–5.80)
RDW: 12.8 % (ref 11.0–15.0)
Total Lymphocyte: 31.6 %
WBC mixed population: 378 cells/uL (ref 200–950)
WBC: 4.3 10*3/uL (ref 3.8–10.8)

## 2018-04-04 LAB — COMPREHENSIVE METABOLIC PANEL
AG Ratio: 2.1 (calc) (ref 1.0–2.5)
ALKALINE PHOSPHATASE (APISO): 55 U/L (ref 40–115)
ALT: 26 U/L (ref 9–46)
AST: 31 U/L (ref 10–35)
Albumin: 4.4 g/dL (ref 3.6–5.1)
BUN: 20 mg/dL (ref 7–25)
CHLORIDE: 104 mmol/L (ref 98–110)
CO2: 29 mmol/L (ref 20–32)
CREATININE: 1.29 mg/dL (ref 0.70–1.33)
Calcium: 9.4 mg/dL (ref 8.6–10.3)
GLUCOSE: 90 mg/dL (ref 65–99)
Globulin: 2.1 g/dL (calc) (ref 1.9–3.7)
Potassium: 4.9 mmol/L (ref 3.5–5.3)
Sodium: 140 mmol/L (ref 135–146)
Total Bilirubin: 0.7 mg/dL (ref 0.2–1.2)
Total Protein: 6.5 g/dL (ref 6.1–8.1)

## 2018-04-04 LAB — PSA: PSA: 0.4 ng/mL (ref <=4.0)

## 2018-04-09 ENCOUNTER — Encounter: Payer: Self-pay | Admitting: Family Medicine

## 2018-04-09 ENCOUNTER — Ambulatory Visit (INDEPENDENT_AMBULATORY_CARE_PROVIDER_SITE_OTHER): Payer: 59 | Admitting: Family Medicine

## 2018-04-09 VITALS — BP 136/78 | HR 66 | Temp 97.0°F | Resp 12 | Ht 74.0 in | Wt 195.0 lb

## 2018-04-09 DIAGNOSIS — Z0000 Encounter for general adult medical examination without abnormal findings: Secondary | ICD-10-CM | POA: Diagnosis not present

## 2018-04-09 MED ORDER — ZOSTER VAC RECOMB ADJUVANTED 50 MCG/0.5ML IM SUSR: 0.5000 mL | Freq: Once | INTRAMUSCULAR | 1 refills | Status: AC

## 2018-04-09 MED ORDER — TRAZODONE HCL 50 MG PO TABS: 25.0000 mg | ORAL_TABLET | Freq: Every evening | ORAL | 3 refills | Status: DC | PRN

## 2018-04-09 MED FILL — traZODone HCL 50 MG TABS: 50 | 30 days supply | Qty: 30 | Fill #0

## 2018-04-09 NOTE — Progress Notes (Signed)
Subjective:    Patient ID: Alex Caldwell, male    DOB: 04-Jun-1961, 57 y.o.   MRN: [id number]  HPI Patient is here today for a complete physical exam.  Patient recently had a colonoscopy which revealed only internal hemorrhoids but no evidence of cancer or polyps.  He sees his urologist annually for a prostate exam however his PSA was recently checked and his lab work and found to be excellent at 0.4.  Overall he is been doing well.  He does report trouble with insomnia.  He states that he falls asleep usually at 8 or 900 in the evening and then will awaken around 1 AM unable to fall back asleep.  He is interested in non-habit-forming options for insomnia.  We discussed sleep hygiene and there is no obvious problems with his sleep hygiene.  Patient has no concern regarding HIV or hepatitis C screening and therefore he politely declines these.  He is due for the shingles vaccine.  His most recent lab work as listed below.  His ten-year risk of cardiovascular disease is 5.7% based on his labs.  Therefore he does not qualify for statin.  His blood pressure is acceptable today at 136/78 Appointment on 04/03/2018  Component Date Value Ref Range Status  . WBC 04/03/2018 4.3  3.8 - 10.8 Thousand/uL Final  . RBC 04/03/2018 4.98  4.20 - 5.80 Million/uL Final  . Hemoglobin 04/03/2018 14.6  13.2 - 17.1 g/dL Final  . HCT 04/03/2018 42.4  38.5 - 50.0 % Final  . MCV 04/03/2018 85.1  80.0 - 100.0 fL Final  . MCH 04/03/2018 29.3  27.0 - 33.0 pg Final  . MCHC 04/03/2018 34.4  32.0 - 36.0 g/dL Final  . RDW 04/03/2018 12.8  11.0 - 15.0 % Final  . Platelets 04/03/2018 208  140 - 400 Thousand/uL Final  . MPV 04/03/2018 9.7  7.5 - 12.5 fL Final  . Neutro Abs 04/03/2018 2,421  1,500 - 7,800 cells/uL Final  . Lymphs Abs 04/03/2018 1,359  850 - 3,900 cells/uL Final  . WBC mixed population 04/03/2018 378  200 - 950 cells/uL Final  . Eosinophils Absolute 04/03/2018 120  15 - 500 cells/uL Final  . Basophils Absolute  04/03/2018 22  0 - 200 cells/uL Final  . Neutrophils Relative % 04/03/2018 56.3  % Final  . Total Lymphocyte 04/03/2018 31.6  % Final  . Monocytes Relative 04/03/2018 8.8  % Final  . Eosinophils Relative 04/03/2018 2.8  % Final  . Basophils Relative 04/03/2018 0.5  % Final  . Glucose, Bld 04/03/2018 90  65 - 99 mg/dL Final   Comment: .            Fasting reference interval .   . BUN 04/03/2018 20  7 - 25 mg/dL Final  . Creat 04/03/2018 1.29  0.70 - 1.33 mg/dL Final   Comment: For patients >64 years of age, the reference limit for Creatinine is approximately 13% higher for people identified as African-American. .   Havery Moros Ratio 67/67/2094 NOT APPLICABLE  6 - 22 (calc) Final  . Sodium 04/03/2018 140  135 - 146 mmol/L Final  . Potassium 04/03/2018 4.9  3.5 - 5.3 mmol/L Final  . Chloride 04/03/2018 104  98 - 110 mmol/L Final  . CO2 04/03/2018 29  20 - 32 mmol/L Final  . Calcium 04/03/2018 9.4  8.6 - 10.3 mg/dL Final  . Total Protein 04/03/2018 6.5  6.1 - 8.1 g/dL Final  . Albumin 04/03/2018 4.4  3.6 - 5.1 g/dL Final  . Globulin 04/03/2018 2.1  1.9 - 3.7 g/dL (calc) Final  . AG Ratio 04/03/2018 2.1  1.0 - 2.5 (calc) Final  . Total Bilirubin 04/03/2018 0.7  0.2 - 1.2 mg/dL Final  . Alkaline phosphatase (APISO) 04/03/2018 55  40 - 115 U/L Final  . AST 04/03/2018 31  10 - 35 U/L Final  . ALT 04/03/2018 26  9 - 46 U/L Final  . Cholesterol 04/03/2018 195  <200 mg/dL Final  . HDL 04/03/2018 60  >40 mg/dL Final  . Triglycerides 04/03/2018 63  <150 mg/dL Final  . LDL Cholesterol (Calc) 04/03/2018 120* mg/dL (calc) Final   Comment: Reference range: <100 . Desirable range <100 mg/dL for primary prevention;   <70 mg/dL for patients with CHD or diabetic patients  with > or = 2 CHD risk factors. Marland Kitchen LDL-C is now calculated using the Martin-Hopkins  calculation, which is a validated novel method providing  better accuracy than the Friedewald equation in the  estimation of LDL-C.   Cresenciano Genre et al. Annamaria Helling. 7846;962(95): 2061-2068  (http://education.[facility].com/faq/FAQ164)   . Total CHOL/HDL Ratio 04/03/2018 3.3  <5.0 (calc) Final  . Non-HDL Cholesterol (Calc) 04/03/2018 135* <130 mg/dL (calc) Final   Comment: For patients with diabetes plus 1 major ASCVD risk  factor, treating to a non-HDL-C goal of <100 mg/dL  (LDL-C of <70 mg/dL) is considered a therapeutic  option.   Marland Kitchen PSA 04/03/2018 0.4  < OR = 4.0 ng/mL Final   Comment: The total PSA value from this assay system is  standardized against the WHO standard. The test  result will be approximately 20% lower when compared  to the equimolar-standardized total PSA (Beckman  Coulter). Comparison of serial PSA results should be  interpreted with this fact in mind. . This test was performed using the Siemens  chemiluminescent method. Values obtained from  different assay methods cannot be used interchangeably. PSA levels, regardless of value, should not be interpreted as absolute evidence of the presence or absence of disease.    Past Medical History:  Diagnosis Date  . Acromioclavicular joint arthritis    Left shoulder  . DJD (degenerative joint disease)   . No pertinent past medical history   . Rotator cuff tear, left    Past Surgical History:  Procedure Laterality Date  . BICEPS TENDON REPAIR  2004   left  . KNEE ARTHROSCOPY W/ ACL RECONSTRUCTION  1995   left  . ROTATOR CUFF REPAIR     No current outpatient medications on file prior to visit.   No current facility-administered medications on file prior to visit.    No Known Allergies Social History   Socioeconomic History  . Marital status: Married    Spouse name: Not on file  . Number of children: Not on file  . Years of education: Not on file  . Highest education level: Not on file  Occupational History  . Not on file  Social Needs  . Financial resource strain: Not on file  . Food insecurity:    Worry: Not on file     Inability: Not on file  . Transportation needs:    Medical: Not on file    Non-medical: Not on file  Tobacco Use  . Smoking status: Never Smoker  . Smokeless tobacco: Never Used  Substance and Sexual Activity  . Alcohol use: Yes    Alcohol/week: 6.0 oz    Types: 10 Standard drinks or equivalent per week  .  Drug use: No  . Sexual activity: Yes  Lifestyle  . Physical activity:    Days per week: Not on file    Minutes per session: Not on file  . Stress: Not on file  Relationships  . Social connections:    Talks on phone: Not on file    Gets together: Not on file    Attends religious service: Not on file    Active member of club or organization: Not on file    Attends meetings of clubs or organizations: Not on file    Relationship status: Not on file  . Intimate partner violence:    Fear of current or ex partner: Not on file    Emotionally abused: Not on file    Physically abused: Not on file    Forced sexual activity: Not on file  Other Topics Concern  . Not on file  Social History Narrative  . Not on file   Family History  Problem Relation Age of Onset  . Cancer Mother        ovarian  . Hyperlipidemia Father   . Hypertension Father   . Colon cancer Neg Hx   . Esophageal cancer Neg Hx   . Stomach cancer Neg Hx       Review of Systems  All other systems reviewed and are negative.      Objective:   Physical Exam  Constitutional: He is oriented to person, place, and time. He appears well-developed and well-nourished. No distress.  HENT:  Head: Normocephalic and atraumatic.  Right Ear: External ear normal.  Left Ear: External ear normal.  Nose: Nose normal.  Mouth/Throat: Oropharynx is clear and moist. No oropharyngeal exudate.  Eyes: Pupils are equal, round, and reactive to light. Conjunctivae and EOM are normal. Right eye exhibits no discharge. Left eye exhibits no discharge. No scleral icterus.  Neck: Normal range of motion. Neck supple. No JVD present. No  tracheal deviation present. No thyromegaly present.  Cardiovascular: Normal rate, regular rhythm, normal heart sounds and intact distal pulses. Exam reveals no gallop and no friction rub.  No murmur heard. Pulmonary/Chest: Effort normal and breath sounds normal. No stridor. No respiratory distress. He has no wheezes. He has no rales. He exhibits no tenderness.  Abdominal: Soft. Bowel sounds are normal. He exhibits no distension and no mass. There is no tenderness. There is no rebound and no guarding.  Musculoskeletal: Normal range of motion. He exhibits no edema or tenderness.  Lymphadenopathy:    He has no cervical adenopathy.  Neurological: He is alert and oriented to person, place, and time. He has normal reflexes. No cranial nerve deficit. He exhibits normal muscle tone. Coordination normal.  Skin: Skin is warm. No rash noted. He is not diaphoretic. No erythema. No pallor.  Psychiatric: He has a normal mood and affect. His behavior is normal. Judgment and thought content normal.  Vitals reviewed.         Assessment & Plan:  Routine general medical examination at a health care facility  Patient's physical exam today is completely normal.  His cancer screening is up-to-date.  His lab work is excellent.  He declines hepatitis C and HIV screening.  I recommended the shingles vaccine and I will order that at his local pharmacy.  The remainder of his immunizations are up-to-date.  Regular anticipatory guidance is provided.  We will try trazodone 25 to 50 mg p.o. nightly as needed insomnia

## 2018-04-16 MED FILL — SHINGRIX 50 MCG SUS: 50 | 1 days supply | Qty: 1 | Fill #0

## 2018-05-07 MED FILL — traZODone HCL 50 MG TABS: 50 | 30 days supply | Qty: 30 | Fill #1

## 2018-05-16 DIAGNOSIS — L02416 Cutaneous abscess of left lower limb: Secondary | ICD-10-CM | POA: Diagnosis not present

## 2018-05-26 DIAGNOSIS — S50811A Abrasion of right forearm, initial encounter: Secondary | ICD-10-CM | POA: Diagnosis not present

## 2018-05-26 DIAGNOSIS — W228XXA Striking against or struck by other objects, initial encounter: Secondary | ICD-10-CM | POA: Diagnosis not present

## 2018-05-28 MED FILL — ACYCLOVIR 400 MG TABLET: 400 | 30 days supply | Qty: 60 | Fill #2

## 2018-06-11 MED FILL — traZODone HCL 50 MG TABS: 50 | 30 days supply | Qty: 30 | Fill #2

## 2018-06-18 DIAGNOSIS — N400 Enlarged prostate without lower urinary tract symptoms: Secondary | ICD-10-CM | POA: Diagnosis not present

## 2018-06-18 DIAGNOSIS — A6000 Herpesviral infection of urogenital system, unspecified: Secondary | ICD-10-CM | POA: Diagnosis not present

## 2018-07-05 MED FILL — traZODone HCL 50 MG TABS: 50 | 30 days supply | Qty: 30 | Fill #3

## 2018-07-09 DIAGNOSIS — S29011A Strain of muscle and tendon of front wall of thorax, initial encounter: Secondary | ICD-10-CM | POA: Diagnosis not present

## 2018-07-16 ENCOUNTER — Ambulatory Visit (INDEPENDENT_AMBULATORY_CARE_PROVIDER_SITE_OTHER): Payer: 59 | Admitting: Sports Medicine

## 2018-07-16 ENCOUNTER — Encounter: Payer: Self-pay | Admitting: Sports Medicine

## 2018-07-16 DIAGNOSIS — S299XXA Unspecified injury of thorax, initial encounter: Secondary | ICD-10-CM | POA: Diagnosis not present

## 2018-07-16 NOTE — Assessment & Plan Note (Addendum)
No rupture of pec major tendon as this was visualized on ultrasound today and appears intact. He does have fluid proximal to the attachment within the muscle belly consistent with partial tear of pectoralis major however.  Discussed with patient this does not require surgery. Advised to avoid activities that may aggravate the pain however anticipate he may return to weight lifting as tolerated.   Follow up as needed.
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
## 2018-07-16 NOTE — Patient Instructions (Signed)
It was nice meeting you today.  You were seen in clinic for follow-up after a injury to your pec muscle.  We ultrasounded the area today and it appears that you may have a small tear in your pectoralis major muscle, however your tendon looks intact.  As we discussed, this does not require surgery.  We would recommend avoiding any activities that aggravate the pain for now, you may continue weight lifting as tolerated.  Please call clinic if you have any questions.  Lovenia Kim MD

## 2018-07-17 ENCOUNTER — Encounter: Payer: Self-pay | Admitting: Sports Medicine

## 2018-07-22 ENCOUNTER — Ambulatory Visit: Payer: 59 | Admitting: Sports Medicine

## 2018-07-22 ENCOUNTER — Encounter

## 2018-08-06 ENCOUNTER — Other Ambulatory Visit: Payer: Self-pay | Admitting: Family Medicine

## 2018-08-06 MED FILL — traZODone HCL 50 MG TABS: 50 | 30 days supply | Qty: 30 | Fill #0

## 2018-08-27 MED FILL — ACYCLOVIR 400 MG TABLET: 400 | 7 days supply | Qty: 21 | Fill #0

## 2018-09-05 MED FILL — traZODone HCL 50 MG TABS: 50 | 30 days supply | Qty: 30 | Fill #1

## 2018-09-30 MED FILL — ACYCLOVIR 400 MG TABLET: 400 | 7 days supply | Qty: 21 | Fill #1

## 2018-09-30 MED FILL — traZODone HCL 50 MG TABS: 50 | 30 days supply | Qty: 30 | Fill #2

## 2018-10-14 MED FILL — SHINGRIX 50 MCG SUS: 50 | 1 days supply | Qty: 1 | Fill #1

## 2018-11-05 MED FILL — traZODone HCL 50 MG TABS: 50 | 30 days supply | Qty: 30 | Fill #3

## 2018-12-03 ENCOUNTER — Other Ambulatory Visit: Payer: Self-pay | Admitting: Family Medicine

## 2018-12-03 MED FILL — traZODone HCL 50 MG TABS: 50 | 30 days supply | Qty: 30 | Fill #0

## 2018-12-03 MED FILL — ACYCLOVIR 400 MG TABLET: 400 | 7 days supply | Qty: 21 | Fill #1 | Status: TO

## 2018-12-18 DIAGNOSIS — J309 Allergic rhinitis, unspecified: Secondary | ICD-10-CM | POA: Diagnosis not present

## 2018-12-23 ENCOUNTER — Encounter: Payer: Self-pay | Admitting: Family Medicine

## 2018-12-23 ENCOUNTER — Ambulatory Visit: Payer: 59 | Admitting: Family Medicine

## 2018-12-23 VITALS — BP 130/80 | HR 64 | Temp 97.5°F | Resp 14 | Ht 74.0 in | Wt 193.0 lb

## 2018-12-23 DIAGNOSIS — J069 Acute upper respiratory infection, unspecified: Secondary | ICD-10-CM

## 2018-12-23 DIAGNOSIS — H8110 Benign paroxysmal vertigo, unspecified ear: Secondary | ICD-10-CM

## 2018-12-23 NOTE — Progress Notes (Signed)
Subjective:    Patient ID: Alex Caldwell, male    DOB: 12/23/1960, 58 y.o.   MRN: [id number]  HPI Last Tuesday, [place], he laid down in bed, and the room began to spin around him.  He felt extremely dizzy.  There was definite vertigo.  When he sat up, the vertigo stopped.  He laid back down in bed and it struck him again.  It felt like the room was spinning.  Symptoms subsided over several minutes.  The next day he went to an urgent care where he was told that his "ears were full of fluid and he was given Astelin nasal spray in addition to Zyrtec for eustachian tube dysfunction.  He has been taking the Astelin and Zyrtec from Wednesday through Sunday.  The vertigo has completely stopped.  However the patient felt much better.  Therefore he discontinued the 2 medication and today he reports postnasal drip and a sore scratchy throat.  He denies any hearing loss or vertigo or tinnitus in his ears.  He denies any sinus pain or otalgia.  Dix-Hallpike maneuver is negative bilaterally Past Medical History:  Diagnosis Date  . Acromioclavicular joint arthritis    Left shoulder  . DJD (degenerative joint disease)   . No pertinent past medical history   . Rotator cuff tear, left    Past Surgical History:  Procedure Laterality Date  . BICEPS TENDON REPAIR  2004   left  . KNEE ARTHROSCOPY W/ ACL RECONSTRUCTION  1995   left  . ROTATOR CUFF REPAIR     Current Outpatient Medications on File Prior to Visit  Medication Sig Dispense Refill  . traZODone (DESYREL) 50 MG tablet TAKE 1/2 TO 1 TABLET BY MOUTH AT BEDTIME AS NEEDED FOR SLEEP 30 tablet 3  . azelastine (ASTELIN) 0.1 % nasal spray Place 2 sprays into both nostrils 2 (two) times daily. Use in each nostril as directed    . cetirizine (ZYRTEC) 10 MG tablet Take 10 mg by mouth daily.     No current facility-administered medications on file prior to visit.    No Known Allergies Social History   Socioeconomic History    . Marital status: Married    Spouse name: Not on file  . Number of children: Not on file  . Years of education: Not on file  . Highest education level: Not on file  Occupational History  . Not on file  Social Needs  . Financial resource strain: Not on file  . Food insecurity:    Worry: Not on file    Inability: Not on file  . Transportation needs:    Medical: Not on file    Non-medical: Not on file  Tobacco Use  . Smoking status: Never Smoker  . Smokeless tobacco: Never Used  Substance and Sexual Activity  . Alcohol use: Yes    Alcohol/week: 10.0 standard drinks    Types: 10 Standard drinks or equivalent per week  . Drug use: No  . Sexual activity: Yes  Lifestyle  . Physical activity:    Days per week: Not on file    Minutes per session: Not on file  . Stress: Not on file  Relationships  . Social connections:    Talks on phone: Not on file    Gets together: Not on file    Attends religious service: Not on file    Active member of club or organization: Not on file    Attends  meetings of clubs or organizations: Not on file    Relationship status: Not on file  . Intimate partner violence:    Fear of current or ex partner: Not on file    Emotionally abused: Not on file    Physically abused: Not on file    Forced sexual activity: Not on file  Other Topics Concern  . Not on file  Social History Narrative  . Not on file      Review of Systems  Neurological: Positive for dizziness.  All other systems reviewed and are negative.      Objective:   Physical Exam  Constitutional: He appears well-developed and well-nourished.  HENT:  Right Ear: Tympanic membrane and ear canal normal.  Left Ear: Tympanic membrane and ear canal normal.  Nose: Nose normal. No mucosal edema or rhinorrhea. Right sinus exhibits no maxillary sinus tenderness and no frontal sinus tenderness. Left sinus exhibits no maxillary sinus tenderness and no frontal sinus tenderness.  Mouth/Throat: No  oropharyngeal exudate, posterior oropharyngeal edema or posterior oropharyngeal erythema.  Eyes: Conjunctivae are normal.  Neck: Neck supple.  Cardiovascular: Normal rate, regular rhythm and normal heart sounds. Exam reveals no gallop and no friction rub.  No murmur heard. Pulmonary/Chest: Effort normal and breath sounds normal. No respiratory distress. He has no wheezes. He has no rales.  Lymphadenopathy:    He has no cervical adenopathy.  Vitals reviewed.         Assessment & Plan:  I believe the patient had benign paroxysmal positional vertigo versus viral labyrinthitis.  I am leaning towards benign paroxysmal positional vertigo.  The symptoms have resolved on their own.  He also has some mild eustachian tube dysfunction and postnasal drip causing a sore scratchy throat most likely due to a viral upper respiratory infection.  I have recommended continuing the Astelin and Zyrtec for eustachian tube dysfunction until symptoms have completely subsided.  As long as his symptoms continue to improve over the next 3 to 4 days no further follow-up is necessary.

## 2019-01-07 MED FILL — traZODone HCL 50 MG TABS: 50 | 30 days supply | Qty: 30 | Fill #1 | Status: TO

## 2019-02-03 MED FILL — traZODone HCL 50 MG TABS: 50 | 30 days supply | Qty: 30 | Fill #0

## 2019-02-03 MED FILL — ACYCLOVIR 400 MG TABLET: 400 | 7 days supply | Qty: 21 | Fill #0

## 2019-03-10 MED FILL — traZODone HCL 50 MG TABS: 50 | 30 days supply | Qty: 30 | Fill #1

## 2019-03-25 DIAGNOSIS — A6009 Herpesviral infection of other urogenital tract: Secondary | ICD-10-CM | POA: Diagnosis not present

## 2019-03-25 DIAGNOSIS — N400 Enlarged prostate without lower urinary tract symptoms: Secondary | ICD-10-CM | POA: Diagnosis not present

## 2019-03-25 MED FILL — ACYCLOVIR 800 MG TABLET: 800 | 10 days supply | Qty: 30 | Fill #0

## 2019-03-25 MED FILL — PHENAZOPYRIDINE 200 MG TAB: 200 | 3 days supply | Qty: 10 | Fill #0

## 2019-04-10 ENCOUNTER — Telehealth: Payer: Self-pay | Admitting: Family Medicine

## 2019-04-10 DIAGNOSIS — R6889 Other general symptoms and signs: Secondary | ICD-10-CM | POA: Diagnosis not present

## 2019-04-10 DIAGNOSIS — E349 Endocrine disorder, unspecified: Secondary | ICD-10-CM | POA: Diagnosis not present

## 2019-04-10 DIAGNOSIS — Z79890 Hormone replacement therapy (postmenopausal): Secondary | ICD-10-CM | POA: Diagnosis not present

## 2019-04-10 DIAGNOSIS — Z139 Encounter for screening, unspecified: Secondary | ICD-10-CM | POA: Diagnosis not present

## 2019-04-10 DIAGNOSIS — Z13228 Encounter for screening for other metabolic disorders: Secondary | ICD-10-CM | POA: Diagnosis not present

## 2019-04-10 DIAGNOSIS — N529 Male erectile dysfunction, unspecified: Secondary | ICD-10-CM | POA: Diagnosis not present

## 2019-04-10 DIAGNOSIS — R5382 Chronic fatigue, unspecified: Secondary | ICD-10-CM | POA: Diagnosis not present

## 2019-04-10 DIAGNOSIS — E291 Testicular hypofunction: Secondary | ICD-10-CM | POA: Diagnosis not present

## 2019-04-10 MED ORDER — TRAZODONE HCL 50 MG PO TABS: 25.0000 mg | ORAL_TABLET | Freq: Every evening | ORAL | 3 refills | Status: DC | PRN

## 2019-04-10 NOTE — Telephone Encounter (Signed)
PT HAS ONLY 1 PILL LEFT

## 2019-04-10 NOTE — Telephone Encounter (Signed)
Medication called/sent to requested pharmacy  

## 2019-04-10 NOTE — Telephone Encounter (Signed)
REFILL ON TRAZODONE TO [facility] TELEPHONE [phone]

## 2019-04-15 ENCOUNTER — Other Ambulatory Visit: Payer: 59

## 2019-04-15 ENCOUNTER — Other Ambulatory Visit: Payer: Self-pay

## 2019-04-15 DIAGNOSIS — Z13220 Encounter for screening for lipoid disorders: Secondary | ICD-10-CM

## 2019-04-15 DIAGNOSIS — Z125 Encounter for screening for malignant neoplasm of prostate: Secondary | ICD-10-CM | POA: Diagnosis not present

## 2019-04-15 DIAGNOSIS — Z0000 Encounter for general adult medical examination without abnormal findings: Secondary | ICD-10-CM | POA: Diagnosis not present

## 2019-04-16 LAB — CBC WITH DIFFERENTIAL/PLATELET
Absolute Monocytes: 340 cells/uL (ref 200–950)
Basophils Absolute: 28 cells/uL (ref 0–200)
Basophils Relative: 0.6 %
Eosinophils Absolute: 101 cells/uL (ref 15–500)
Eosinophils Relative: 2.2 %
HCT: 41.5 % (ref 38.5–50.0)
Hemoglobin: 14.3 g/dL (ref 13.2–17.1)
Lymphs Abs: 1247 cells/uL (ref 850–3900)
MCH: 30.5 pg (ref 27.0–33.0)
MCHC: 34.5 g/dL (ref 32.0–36.0)
MCV: 88.5 fL (ref 80.0–100.0)
MPV: 9.9 fL (ref 7.5–12.5)
Monocytes Relative: 7.4 %
Neutro Abs: 2884 cells/uL (ref 1500–7800)
Neutrophils Relative %: 62.7 %
Platelets: 223 10*3/uL (ref 140–400)
RBC: 4.69 10*6/uL (ref 4.20–5.80)
RDW: 12.9 % (ref 11.0–15.0)
Total Lymphocyte: 27.1 %
WBC: 4.6 10*3/uL (ref 3.8–10.8)

## 2019-04-16 LAB — COMPLETE METABOLIC PANEL WITH GFR
AG Ratio: 1.9 (calc) (ref 1.0–2.5)
ALT: 22 U/L (ref 9–46)
AST: 20 U/L (ref 10–35)
Albumin: 4.3 g/dL (ref 3.6–5.1)
Alkaline phosphatase (APISO): 43 U/L (ref 35–144)
BUN/Creatinine Ratio: 13 (calc) (ref 6–22)
BUN: 19 mg/dL (ref 7–25)
CO2: 28 mmol/L (ref 20–32)
Calcium: 9.5 mg/dL (ref 8.6–10.3)
Chloride: 103 mmol/L (ref 98–110)
Creat: 1.42 mg/dL — ABNORMAL HIGH (ref 0.70–1.33)
GFR, Est African American: 63 mL/min/{1.73_m2} (ref >=60)
GFR, Est Non African American: 54 mL/min/{1.73_m2} — ABNORMAL LOW (ref >=60)
Globulin: 2.3 g/dL (calc) (ref 1.9–3.7)
Glucose, Bld: 101 mg/dL — ABNORMAL HIGH (ref 65–99)
Potassium: 4.8 mmol/L (ref 3.5–5.3)
Sodium: 140 mmol/L (ref 135–146)
Total Bilirubin: 0.6 mg/dL (ref 0.2–1.2)
Total Protein: 6.6 g/dL (ref 6.1–8.1)

## 2019-04-16 LAB — LIPID PANEL
Cholesterol: 177 mg/dL (ref <200)
HDL: 68 mg/dL (ref >=40)
LDL Cholesterol (Calc): 94 mg/dL (calc)
Non-HDL Cholesterol (Calc): 109 mg/dL (calc) (ref <130)
Total CHOL/HDL Ratio: 2.6 (calc) (ref <5.0)
Triglycerides: 63 mg/dL (ref <150)

## 2019-04-16 LAB — PSA: PSA: 0.4 ng/mL (ref <=4.0)

## 2019-04-21 ENCOUNTER — Ambulatory Visit (INDEPENDENT_AMBULATORY_CARE_PROVIDER_SITE_OTHER): Payer: 59 | Admitting: Family Medicine

## 2019-04-21 ENCOUNTER — Encounter: Payer: Self-pay | Admitting: Family Medicine

## 2019-04-21 ENCOUNTER — Other Ambulatory Visit: Payer: Self-pay

## 2019-04-21 VITALS — BP 130/62 | HR 76 | Temp 97.7°F | Resp 14 | Ht 74.0 in | Wt 189.0 lb

## 2019-04-21 DIAGNOSIS — Z0001 Encounter for general adult medical examination with abnormal findings: Secondary | ICD-10-CM

## 2019-04-21 DIAGNOSIS — E038 Other specified hypothyroidism: Secondary | ICD-10-CM

## 2019-04-21 DIAGNOSIS — N289 Disorder of kidney and ureter, unspecified: Secondary | ICD-10-CM

## 2019-04-21 DIAGNOSIS — Z0000 Encounter for general adult medical examination without abnormal findings: Secondary | ICD-10-CM

## 2019-04-21 DIAGNOSIS — E538 Deficiency of other specified B group vitamins: Secondary | ICD-10-CM

## 2019-04-21 DIAGNOSIS — E039 Hypothyroidism, unspecified: Secondary | ICD-10-CM | POA: Diagnosis not present

## 2019-04-21 MED ORDER — VITAMIN B-12 1000 MCG PO TABS: 1000.0000 ug | ORAL_TABLET | Freq: Every day | ORAL | 5 refills | Status: DC

## 2019-04-21 NOTE — Progress Notes (Signed)
Subjective:    Patient ID: Alex Caldwell, male    DOB: Apr 28, 1961, 58 y.o.   MRN: [id number]  HPI Patient is here today for a complete physical exam.  Patient had a colonoscopy last year that was normal.  Therefore his colon cancer screening is up-to-date.  His PSA was recently checked on his labs as listed below and was normal.  Patient is currently staying at [facility].  He recently went to a clinic there complaining of fatigue.  Apparently this is a "boutique clinic".  Patient states that it deals with physical rejuvenation, etc.  They checked a number of labs including a testosterone level that was greater than 600 with a borderline low free testosterone level.  His TSH was slightly higher than 6 with a normal T3.  The doctor there recommended testosterone replacement as well as levothyroxine at 25 mcg a day.  The patient's vitamin B12 level was also found to be low at 188.  His CMP revealed elevated creatinine at 1.5.  His baseline last year was 1.29.  The patient states that he has been taking ibuprofen on a daily basis for various joint pains.  He is also not drinking enough water.  Patient is concerned because he is lost 6 pounds since his physical last year although with the coronavirus pandemic and quarantine he has not been eating out and he is also decreased the amount of beer and soda that he is drinking. Lab on 04/15/2019  Component Date Value Ref Range Status  . WBC 04/15/2019 4.6  3.8 - 10.8 Thousand/uL Final  . RBC 04/15/2019 4.69  4.20 - 5.80 Million/uL Final  . Hemoglobin 04/15/2019 14.3  13.2 - 17.1 g/dL Final  . HCT 04/15/2019 41.5  38.5 - 50.0 % Final  . MCV 04/15/2019 88.5  80.0 - 100.0 fL Final  . MCH 04/15/2019 30.5  27.0 - 33.0 pg Final  . MCHC 04/15/2019 34.5  32.0 - 36.0 g/dL Final  . RDW 04/15/2019 12.9  11.0 - 15.0 % Final  . Platelets 04/15/2019 223  140 - 400 Thousand/uL Final  . MPV 04/15/2019 9.9  7.5 - 12.5 fL Final  . Neutro Abs 04/15/2019 2,884  1,500 -  7,800 cells/uL Final  . Lymphs Abs 04/15/2019 1,247  850 - 3,900 cells/uL Final  . Absolute Monocytes 04/15/2019 340  200 - 950 cells/uL Final  . Eosinophils Absolute 04/15/2019 101  15 - 500 cells/uL Final  . Basophils Absolute 04/15/2019 28  0 - 200 cells/uL Final  . Neutrophils Relative % 04/15/2019 62.7  % Final  . Total Lymphocyte 04/15/2019 27.1  % Final  . Monocytes Relative 04/15/2019 7.4  % Final  . Eosinophils Relative 04/15/2019 2.2  % Final  . Basophils Relative 04/15/2019 0.6  % Final  . Glucose, Bld 04/15/2019 101* 65 - 99 mg/dL Final   Comment: .            Fasting reference interval . For someone without known diabetes, a glucose value between 100 and 125 mg/dL is consistent with prediabetes and should be confirmed with a follow-up test. .   . BUN 04/15/2019 19  7 - 25 mg/dL Final  . Creat 04/15/2019 1.42* 0.70 - 1.33 mg/dL Final   Comment: For patients >26 years of age, the reference limit for Creatinine is approximately 13% higher for people identified as African-American. .   . GFR, Est Non African American 04/15/2019 54* > OR = 60 mL/min/1.6m2 Final  . GFR, Est  African American 04/15/2019 63  > OR = 60 mL/min/1.31m2 Final  . BUN/Creatinine Ratio 04/15/2019 13  6 - 22 (calc) Final  . Sodium 04/15/2019 140  135 - 146 mmol/L Final  . Potassium 04/15/2019 4.8  3.5 - 5.3 mmol/L Final  . Chloride 04/15/2019 103  98 - 110 mmol/L Final  . CO2 04/15/2019 28  20 - 32 mmol/L Final  . Calcium 04/15/2019 9.5  8.6 - 10.3 mg/dL Final  . Total Protein 04/15/2019 6.6  6.1 - 8.1 g/dL Final  . Albumin 04/15/2019 4.3  3.6 - 5.1 g/dL Final  . Globulin 04/15/2019 2.3  1.9 - 3.7 g/dL (calc) Final  . AG Ratio 04/15/2019 1.9  1.0 - 2.5 (calc) Final  . Total Bilirubin 04/15/2019 0.6  0.2 - 1.2 mg/dL Final  . Alkaline phosphatase (APISO) 04/15/2019 43  35 - 144 U/L Final  . AST 04/15/2019 20  10 - 35 U/L Final  . ALT 04/15/2019 22  9 - 46 U/L Final  . Cholesterol 04/15/2019 177   <200 mg/dL Final  . HDL 04/15/2019 68  > OR = 40 mg/dL Final  . Triglycerides 04/15/2019 63  <150 mg/dL Final  . LDL Cholesterol (Calc) 04/15/2019 94  mg/dL (calc) Final   Comment: Reference range: <100 . Desirable range <100 mg/dL for primary prevention;   <70 mg/dL for patients with CHD or diabetic patients  with > or = 2 CHD risk factors. Marland Kitchen LDL-C is now calculated using the Martin-Hopkins  calculation, which is a validated novel method providing  better accuracy than the Friedewald equation in the  estimation of LDL-C.  Cresenciano Genre et al. Annamaria Helling. 6659;935(70): 2061-2068  (http://education.[facility].com/faq/FAQ164)   . Total CHOL/HDL Ratio 04/15/2019 2.6  <5.0 (calc) Final  . Non-HDL Cholesterol (Calc) 04/15/2019 109  <130 mg/dL (calc) Final   Comment: For patients with diabetes plus 1 major ASCVD risk  factor, treating to a non-HDL-C goal of <100 mg/dL  (LDL-C of <70 mg/dL) is considered a therapeutic  option.   Marland Kitchen PSA 04/15/2019 0.4  < OR = 4.0 ng/mL Final   Comment: The total PSA value from this assay system is  standardized against the WHO standard. The test  result will be approximately 20% lower when compared  to the equimolar-standardized total PSA (Beckman  Coulter). Comparison of serial PSA results should be  interpreted with this fact in mind. . This test was performed using the Siemens  chemiluminescent method. Values obtained from  different assay methods cannot be used interchangeably. PSA levels, regardless of value, should not be interpreted as absolute evidence of the presence or absence of disease.    Past Medical History:  Diagnosis Date  . Acromioclavicular joint arthritis    Left shoulder  . DJD (degenerative joint disease)   . No pertinent past medical history   . Rotator cuff tear, left    Past Surgical History:  Procedure Laterality Date  . BICEPS TENDON REPAIR  2004   left  . KNEE ARTHROSCOPY W/ ACL RECONSTRUCTION  1995   left  .  ROTATOR CUFF REPAIR     Current Outpatient Medications on File Prior to Visit  Medication Sig Dispense Refill  . traZODone (DESYREL) 50 MG tablet Take 0.5-1 tablets (25-50 mg total) by mouth at bedtime as needed. for sleep 30 tablet 3   No current facility-administered medications on file prior to visit.    No Known Allergies Social History   Socioeconomic History  . Marital status: Married  Spouse name: Not on file  . Number of children: Not on file  . Years of education: Not on file  . Highest education level: Not on file  Occupational History  . Not on file  Social Needs  . Financial resource strain: Not on file  . Food insecurity    Worry: Not on file    Inability: Not on file  . Transportation needs    Medical: Not on file    Non-medical: Not on file  Tobacco Use  . Smoking status: Never Smoker  . Smokeless tobacco: Never Used  Substance and Sexual Activity  . Alcohol use: Yes    Alcohol/week: 10.0 standard drinks    Types: 10 Standard drinks or equivalent per week  . Drug use: No  . Sexual activity: Yes  Lifestyle  . Physical activity    Days per week: Not on file    Minutes per session: Not on file  . Stress: Not on file  Relationships  . Social Herbalist on phone: Not on file    Gets together: Not on file    Attends religious service: Not on file    Active member of club or organization: Not on file    Attends meetings of clubs or organizations: Not on file    Relationship status: Not on file  . Intimate partner violence    Fear of current or ex partner: Not on file    Emotionally abused: Not on file    Physically abused: Not on file    Forced sexual activity: Not on file  Other Topics Concern  . Not on file  Social History Narrative  . Not on file   Family History  Problem Relation Age of Onset  . Cancer Mother        ovarian  . Hyperlipidemia Father   . Hypertension Father   . Colon cancer Neg Hx   . Esophageal cancer Neg Hx    . Stomach cancer Neg Hx       Review of Systems  All other systems reviewed and are negative.      Objective:   Physical Exam  Constitutional: He is oriented to person, place, and time. He appears well-developed and well-nourished. No distress.  HENT:  Head: Normocephalic and atraumatic.  Right Ear: External ear normal.  Left Ear: External ear normal.  Nose: Nose normal.  Mouth/Throat: Oropharynx is clear and moist. No oropharyngeal exudate.  Eyes: Pupils are equal, round, and reactive to light. Conjunctivae and EOM are normal. Right eye exhibits no discharge. Left eye exhibits no discharge. No scleral icterus.  Neck: Normal range of motion. Neck supple. No JVD present. No tracheal deviation present. No thyromegaly present.  Cardiovascular: Normal rate, regular rhythm, normal heart sounds and intact distal pulses. Exam reveals no gallop and no friction rub.  No murmur heard. Pulmonary/Chest: Effort normal and breath sounds normal. No stridor. No respiratory distress. He has no wheezes. He has no rales. He exhibits no tenderness.  Abdominal: Soft. Bowel sounds are normal. He exhibits no distension and no mass. There is no abdominal tenderness. There is no rebound and no guarding.  Musculoskeletal: Normal range of motion.        General: No tenderness or edema.  Lymphadenopathy:    He has no cervical adenopathy.  Neurological: He is alert and oriented to person, place, and time. He has normal reflexes. No cranial nerve deficit. He exhibits normal muscle tone. Coordination normal.  Skin: Skin  is warm. No rash noted. He is not diaphoretic. No erythema. No pallor.  Psychiatric: He has a normal mood and affect. His behavior is normal. Judgment and thought content normal.  Vitals reviewed.         Assessment & Plan:  1. Subclinical hypothyroidism Patient's TSH is slightly elevated at 6 greater than 6 however his thyroid levels are normal.  I will repeat a TSH however if his TSH is  within the 6 range I would not recommend replacement with levothyroxine but would monitor it every 6 months - TSH - T4, free  2. Routine general medical examination at a health care facility BSA is normal and colon cancer screening is up-to-date.  Cholesterol is excellent.  Immunizations are up-to-date.  He is already had a shingles vaccine.  3. Low serum vitamin B12 Begin vitamin B12 1000 mcg daily and recheck a B12 level in 2 months - Vitamin B12; Future  4. Renal insufficiency Discontinue all NSAIDs.  Drink more water and recheck a BMP in 1-2 months.  Ultrasound if persistent - BASIC METABOLIC PANEL WITH GFR; Future

## 2019-04-22 ENCOUNTER — Ambulatory Visit: Payer: 59

## 2019-04-28 ENCOUNTER — Other Ambulatory Visit: Payer: Self-pay

## 2019-04-28 ENCOUNTER — Ambulatory Visit (INDEPENDENT_AMBULATORY_CARE_PROVIDER_SITE_OTHER): Payer: 59

## 2019-04-28 DIAGNOSIS — E538 Deficiency of other specified B group vitamins: Secondary | ICD-10-CM

## 2019-04-28 MED ORDER — CYANOCOBALAMIN 1000 MCG/ML IJ SOLN
1000.0000 ug | Freq: Once | INTRAMUSCULAR | Status: AC
  Administered 2019-04-28: 1000 ug via INTRAMUSCULAR

## 2019-04-28 NOTE — Progress Notes (Signed)
Pt came in for b12 injection, given in R deltoid. Pt tolerated well.

## 2019-05-05 ENCOUNTER — Telehealth: Payer: Self-pay | Admitting: Family Medicine

## 2019-05-05 MED FILL — ACYCLOVIR 800 MG TABLET: 800 | 10 days supply | Qty: 30 | Fill #1

## 2019-05-05 NOTE — Telephone Encounter (Signed)
Left detailed message that he need labs to check for thyroid and we needed a base line b12 before recommending injections. He was to take the tablets x 2 months and then come back in to have rechecked.

## 2019-05-05 NOTE — Telephone Encounter (Signed)
Patient wanting to know if he can get a prescription for thyroid medication and would to know if he can get monthly b12 shots as well  [phone]

## 2019-05-13 ENCOUNTER — Encounter (INDEPENDENT_AMBULATORY_CARE_PROVIDER_SITE_OTHER): Payer: Self-pay

## 2019-05-13 ENCOUNTER — Other Ambulatory Visit: Payer: Self-pay

## 2019-05-13 ENCOUNTER — Other Ambulatory Visit: Payer: 59

## 2019-05-13 DIAGNOSIS — E538 Deficiency of other specified B group vitamins: Secondary | ICD-10-CM | POA: Diagnosis not present

## 2019-05-13 DIAGNOSIS — N289 Disorder of kidney and ureter, unspecified: Secondary | ICD-10-CM | POA: Diagnosis not present

## 2019-05-13 DIAGNOSIS — E039 Hypothyroidism, unspecified: Secondary | ICD-10-CM | POA: Diagnosis not present

## 2019-05-13 DIAGNOSIS — E038 Other specified hypothyroidism: Secondary | ICD-10-CM

## 2019-05-14 LAB — BASIC METABOLIC PANEL WITH GFR
BUN/Creatinine Ratio: 12 (calc) (ref 6–22)
BUN: 16 mg/dL (ref 7–25)
CO2: 29 mmol/L (ref 20–32)
Calcium: 9.5 mg/dL (ref 8.6–10.3)
Chloride: 103 mmol/L (ref 98–110)
Creat: 1.38 mg/dL — ABNORMAL HIGH (ref 0.70–1.33)
GFR, Est African American: 65 mL/min/{1.73_m2} (ref >=60)
GFR, Est Non African American: 56 mL/min/{1.73_m2} — ABNORMAL LOW (ref >=60)
Glucose, Bld: 80 mg/dL (ref 65–99)
Potassium: 4.6 mmol/L (ref 3.5–5.3)
Sodium: 139 mmol/L (ref 135–146)

## 2019-05-14 LAB — T4, FREE: Free T4: 1.2 ng/dL (ref 0.8–1.8)

## 2019-05-14 LAB — TSH: TSH: 1.73 mIU/L (ref 0.40–4.50)

## 2019-05-14 LAB — VITAMIN B12: Vitamin B-12: 710 pg/mL (ref 200–1100)

## 2019-06-02 ENCOUNTER — Other Ambulatory Visit: Payer: Self-pay

## 2019-06-02 ENCOUNTER — Ambulatory Visit (INDEPENDENT_AMBULATORY_CARE_PROVIDER_SITE_OTHER): Payer: 59

## 2019-06-02 DIAGNOSIS — E538 Deficiency of other specified B group vitamins: Secondary | ICD-10-CM | POA: Diagnosis not present

## 2019-06-02 MED ORDER — CYANOCOBALAMIN 1000 MCG/ML IJ SOLN
1000.0000 ug | Freq: Once | INTRAMUSCULAR | Status: AC
  Administered 2019-06-02: 1000 ug via INTRAMUSCULAR

## 2019-06-02 NOTE — Progress Notes (Signed)
Pt came in for b12 injection. Administered in L deltoid. Pt tolerated well. 

## 2019-06-23 ENCOUNTER — Other Ambulatory Visit: Payer: 59

## 2019-07-18 DIAGNOSIS — I10 Essential (primary) hypertension: Secondary | ICD-10-CM | POA: Diagnosis not present

## 2019-07-18 DIAGNOSIS — R5383 Other fatigue: Secondary | ICD-10-CM | POA: Diagnosis not present

## 2019-07-18 DIAGNOSIS — E538 Deficiency of other specified B group vitamins: Secondary | ICD-10-CM | POA: Diagnosis not present

## 2019-07-21 ENCOUNTER — Other Ambulatory Visit: Payer: Self-pay

## 2019-07-21 ENCOUNTER — Ambulatory Visit (INDEPENDENT_AMBULATORY_CARE_PROVIDER_SITE_OTHER): Payer: 59 | Admitting: Family Medicine

## 2019-07-21 ENCOUNTER — Encounter: Payer: Self-pay | Admitting: Family Medicine

## 2019-07-21 VITALS — BP 142/78 | HR 72 | Temp 98.6°F | Resp 14 | Ht 74.0 in | Wt 194.0 lb

## 2019-07-21 DIAGNOSIS — N183 Chronic kidney disease, stage 3 unspecified: Secondary | ICD-10-CM

## 2019-07-21 DIAGNOSIS — R030 Elevated blood-pressure reading, without diagnosis of hypertension: Secondary | ICD-10-CM | POA: Diagnosis not present

## 2019-07-21 MED ORDER — LISINOPRIL 10 MG PO TABS: 10.0000 mg | ORAL_TABLET | Freq: Every day | ORAL | 3 refills | Status: DC
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
## 2019-08-05 ENCOUNTER — Other Ambulatory Visit: Payer: Self-pay | Admitting: Family Medicine

## 2019-08-06 DIAGNOSIS — N139 Obstructive and reflux uropathy, unspecified: Secondary | ICD-10-CM | POA: Diagnosis not present

## 2019-08-06 DIAGNOSIS — R1031 Right lower quadrant pain: Secondary | ICD-10-CM | POA: Diagnosis not present

## 2019-08-06 DIAGNOSIS — N159 Renal tubulo-interstitial disease, unspecified: Secondary | ICD-10-CM | POA: Diagnosis not present

## 2019-08-06 DIAGNOSIS — N390 Urinary tract infection, site not specified: Secondary | ICD-10-CM | POA: Diagnosis not present

## 2019-08-26 MED FILL — ACYCLOVIR 800 MG TABLET: 800 | 10 days supply | Qty: 30 | Fill #2

## 2019-09-29 DIAGNOSIS — H05242 Constant exophthalmos, left eye: Secondary | ICD-10-CM | POA: Diagnosis not present

## 2019-12-02 ENCOUNTER — Other Ambulatory Visit: Payer: Self-pay | Admitting: Family Medicine

## 2019-12-11 DIAGNOSIS — L578 Other skin changes due to chronic exposure to nonionizing radiation: Secondary | ICD-10-CM | POA: Diagnosis not present

## 2019-12-11 DIAGNOSIS — X32XXXA Exposure to sunlight, initial encounter: Secondary | ICD-10-CM | POA: Diagnosis not present

## 2019-12-11 DIAGNOSIS — Z7189 Other specified counseling: Secondary | ICD-10-CM | POA: Diagnosis not present

## 2019-12-11 DIAGNOSIS — L570 Actinic keratosis: Secondary | ICD-10-CM | POA: Diagnosis not present

## 2019-12-30 DIAGNOSIS — H05242 Constant exophthalmos, left eye: Secondary | ICD-10-CM | POA: Diagnosis not present

## 2020-01-08 DIAGNOSIS — L578 Other skin changes due to chronic exposure to nonionizing radiation: Secondary | ICD-10-CM | POA: Diagnosis not present

## 2020-01-08 DIAGNOSIS — Z7189 Other specified counseling: Secondary | ICD-10-CM | POA: Diagnosis not present

## 2020-01-08 DIAGNOSIS — X32XXXA Exposure to sunlight, initial encounter: Secondary | ICD-10-CM | POA: Diagnosis not present

## 2020-01-08 DIAGNOSIS — L570 Actinic keratosis: Secondary | ICD-10-CM | POA: Diagnosis not present

## 2020-02-11 DIAGNOSIS — L570 Actinic keratosis: Secondary | ICD-10-CM | POA: Diagnosis not present

## 2020-02-11 DIAGNOSIS — Z1152 Encounter for screening for COVID-19: Secondary | ICD-10-CM | POA: Diagnosis not present

## 2020-02-11 DIAGNOSIS — B078 Other viral warts: Secondary | ICD-10-CM | POA: Diagnosis not present

## 2020-02-11 DIAGNOSIS — D485 Neoplasm of uncertain behavior of skin: Secondary | ICD-10-CM | POA: Diagnosis not present

## 2020-02-11 DIAGNOSIS — X32XXXA Exposure to sunlight, initial encounter: Secondary | ICD-10-CM | POA: Diagnosis not present

## 2020-02-11 DIAGNOSIS — L578 Other skin changes due to chronic exposure to nonionizing radiation: Secondary | ICD-10-CM | POA: Diagnosis not present

## 2020-03-05 ENCOUNTER — Other Ambulatory Visit: Payer: Self-pay | Admitting: Family Medicine

## 2020-03-24 DIAGNOSIS — L578 Other skin changes due to chronic exposure to nonionizing radiation: Secondary | ICD-10-CM | POA: Diagnosis not present

## 2020-03-24 DIAGNOSIS — C44629 Squamous cell carcinoma of skin of left upper limb, including shoulder: Secondary | ICD-10-CM | POA: Diagnosis not present

## 2020-03-24 DIAGNOSIS — X32XXXA Exposure to sunlight, initial encounter: Secondary | ICD-10-CM | POA: Diagnosis not present

## 2020-03-24 DIAGNOSIS — D485 Neoplasm of uncertain behavior of skin: Secondary | ICD-10-CM | POA: Diagnosis not present

## 2020-03-24 DIAGNOSIS — L570 Actinic keratosis: Secondary | ICD-10-CM | POA: Diagnosis not present

## 2020-04-09 DIAGNOSIS — C44629 Squamous cell carcinoma of skin of left upper limb, including shoulder: Secondary | ICD-10-CM | POA: Diagnosis not present

## 2020-04-12 DIAGNOSIS — N400 Enlarged prostate without lower urinary tract symptoms: Secondary | ICD-10-CM | POA: Diagnosis not present

## 2020-04-20 DIAGNOSIS — A6009 Herpesviral infection of other urogenital tract: Secondary | ICD-10-CM | POA: Diagnosis not present

## 2020-04-20 DIAGNOSIS — N400 Enlarged prostate without lower urinary tract symptoms: Secondary | ICD-10-CM | POA: Diagnosis not present

## 2020-05-06 DIAGNOSIS — Z85828 Personal history of other malignant neoplasm of skin: Secondary | ICD-10-CM | POA: Diagnosis not present

## 2020-05-06 DIAGNOSIS — L03114 Cellulitis of left upper limb: Secondary | ICD-10-CM | POA: Diagnosis not present

## 2020-05-06 DIAGNOSIS — Z08 Encounter for follow-up examination after completed treatment for malignant neoplasm: Secondary | ICD-10-CM | POA: Diagnosis not present

## 2020-05-06 DIAGNOSIS — Z48817 Encounter for surgical aftercare following surgery on the skin and subcutaneous tissue: Secondary | ICD-10-CM | POA: Diagnosis not present

## 2020-05-07 DIAGNOSIS — Z08 Encounter for follow-up examination after completed treatment for malignant neoplasm: Secondary | ICD-10-CM | POA: Diagnosis not present

## 2020-05-07 DIAGNOSIS — Z48817 Encounter for surgical aftercare following surgery on the skin and subcutaneous tissue: Secondary | ICD-10-CM | POA: Diagnosis not present

## 2020-05-07 DIAGNOSIS — Z85828 Personal history of other malignant neoplasm of skin: Secondary | ICD-10-CM | POA: Diagnosis not present

## 2020-05-10 MED FILL — valACYclovir HCL 1 GM TABS: 1 | 5 days supply | Qty: 5 | Fill #0

## 2020-06-09 MED FILL — valACYclovir HCL 1 GM TABS: 1 | 5 days supply | Qty: 5 | Fill #1

## 2020-06-13 ENCOUNTER — Other Ambulatory Visit (HOSPITAL_COMMUNITY): Payer: Self-pay | Admitting: Urology

## 2020-06-14 MED FILL — valACYclovir HCL 1 GM TABS: 1 | 7 days supply | Qty: 14 | Fill #0

## 2020-07-04 ENCOUNTER — Other Ambulatory Visit: Payer: Self-pay | Admitting: Family Medicine

## 2020-07-12 DIAGNOSIS — H524 Presbyopia: Secondary | ICD-10-CM | POA: Diagnosis not present

## 2020-07-27 DIAGNOSIS — N486 Induration penis plastica: Secondary | ICD-10-CM | POA: Diagnosis not present

## 2020-09-02 DIAGNOSIS — Z85828 Personal history of other malignant neoplasm of skin: Secondary | ICD-10-CM | POA: Diagnosis not present

## 2020-09-02 DIAGNOSIS — L570 Actinic keratosis: Secondary | ICD-10-CM | POA: Diagnosis not present

## 2020-09-02 DIAGNOSIS — X32XXXA Exposure to sunlight, initial encounter: Secondary | ICD-10-CM | POA: Diagnosis not present

## 2020-09-02 DIAGNOSIS — D485 Neoplasm of uncertain behavior of skin: Secondary | ICD-10-CM | POA: Diagnosis not present

## 2020-09-02 DIAGNOSIS — B078 Other viral warts: Secondary | ICD-10-CM | POA: Diagnosis not present

## 2020-09-02 DIAGNOSIS — Z08 Encounter for follow-up examination after completed treatment for malignant neoplasm: Secondary | ICD-10-CM | POA: Diagnosis not present

## 2020-09-02 DIAGNOSIS — L578 Other skin changes due to chronic exposure to nonionizing radiation: Secondary | ICD-10-CM | POA: Diagnosis not present

## 2020-09-16 DIAGNOSIS — M4802 Spinal stenosis, cervical region: Secondary | ICD-10-CM | POA: Diagnosis not present

## 2020-09-16 DIAGNOSIS — X32XXXA Exposure to sunlight, initial encounter: Secondary | ICD-10-CM | POA: Diagnosis not present

## 2020-09-16 DIAGNOSIS — L570 Actinic keratosis: Secondary | ICD-10-CM | POA: Diagnosis not present

## 2020-09-16 DIAGNOSIS — B078 Other viral warts: Secondary | ICD-10-CM | POA: Diagnosis not present

## 2020-09-16 DIAGNOSIS — M2578 Osteophyte, vertebrae: Secondary | ICD-10-CM | POA: Diagnosis not present

## 2020-09-16 DIAGNOSIS — M5002 Cervical disc disorder with myelopathy, mid-cervical region, unspecified level: Secondary | ICD-10-CM | POA: Diagnosis not present

## 2020-09-16 DIAGNOSIS — R208 Other disturbances of skin sensation: Secondary | ICD-10-CM | POA: Diagnosis not present

## 2020-09-16 DIAGNOSIS — L538 Other specified erythematous conditions: Secondary | ICD-10-CM | POA: Diagnosis not present

## 2020-09-16 DIAGNOSIS — M9961 Osseous and subluxation stenosis of intervertebral foramina of cervical region: Secondary | ICD-10-CM | POA: Diagnosis not present

## 2020-09-17 DIAGNOSIS — Z23 Encounter for immunization: Secondary | ICD-10-CM | POA: Diagnosis not present

## 2020-10-04 DIAGNOSIS — M546 Pain in thoracic spine: Secondary | ICD-10-CM | POA: Diagnosis not present

## 2020-10-04 DIAGNOSIS — M542 Cervicalgia: Secondary | ICD-10-CM | POA: Diagnosis not present

## 2020-10-07 DIAGNOSIS — M546 Pain in thoracic spine: Secondary | ICD-10-CM | POA: Diagnosis not present

## 2020-10-07 DIAGNOSIS — M542 Cervicalgia: Secondary | ICD-10-CM | POA: Diagnosis not present

## 2020-10-13 DIAGNOSIS — M542 Cervicalgia: Secondary | ICD-10-CM | POA: Diagnosis not present

## 2020-10-13 DIAGNOSIS — M546 Pain in thoracic spine: Secondary | ICD-10-CM | POA: Diagnosis not present

## 2020-10-15 DIAGNOSIS — M542 Cervicalgia: Secondary | ICD-10-CM | POA: Diagnosis not present

## 2020-10-15 DIAGNOSIS — M546 Pain in thoracic spine: Secondary | ICD-10-CM | POA: Diagnosis not present

## 2020-10-20 DIAGNOSIS — M546 Pain in thoracic spine: Secondary | ICD-10-CM | POA: Diagnosis not present

## 2020-10-20 DIAGNOSIS — M542 Cervicalgia: Secondary | ICD-10-CM | POA: Diagnosis not present

## 2020-10-25 DIAGNOSIS — M546 Pain in thoracic spine: Secondary | ICD-10-CM | POA: Diagnosis not present

## 2020-10-25 DIAGNOSIS — M542 Cervicalgia: Secondary | ICD-10-CM | POA: Diagnosis not present

## 2020-11-02 ENCOUNTER — Other Ambulatory Visit: Payer: Self-pay | Admitting: Family Medicine

## 2020-11-02 MED FILL — valACYclovir HCL 1 GM TABS: 1 | 7 days supply | Qty: 14 | Fill #1

## 2020-11-03 DIAGNOSIS — M546 Pain in thoracic spine: Secondary | ICD-10-CM | POA: Diagnosis not present

## 2020-11-03 DIAGNOSIS — M542 Cervicalgia: Secondary | ICD-10-CM | POA: Diagnosis not present

## 2020-11-05 DIAGNOSIS — M546 Pain in thoracic spine: Secondary | ICD-10-CM | POA: Diagnosis not present

## 2020-11-05 DIAGNOSIS — M542 Cervicalgia: Secondary | ICD-10-CM | POA: Diagnosis not present

## 2020-11-08 ENCOUNTER — Other Ambulatory Visit (HOSPITAL_COMMUNITY): Payer: Self-pay | Admitting: Orthopedic Surgery

## 2020-11-08 DIAGNOSIS — M7521 Bicipital tendinitis, right shoulder: Secondary | ICD-10-CM | POA: Diagnosis not present

## 2020-11-08 MED FILL — predniSONE 10 MG TABS: 10 | 12 days supply | Qty: 48 | Fill #0

## 2020-11-10 DIAGNOSIS — M542 Cervicalgia: Secondary | ICD-10-CM | POA: Diagnosis not present

## 2020-11-10 DIAGNOSIS — M546 Pain in thoracic spine: Secondary | ICD-10-CM | POA: Diagnosis not present

## 2020-11-12 ENCOUNTER — Other Ambulatory Visit: Payer: Self-pay

## 2020-11-12 ENCOUNTER — Telehealth: Payer: Self-pay

## 2020-11-12 DIAGNOSIS — E038 Other specified hypothyroidism: Secondary | ICD-10-CM

## 2020-11-12 DIAGNOSIS — Z13220 Encounter for screening for lipoid disorders: Secondary | ICD-10-CM

## 2020-11-12 DIAGNOSIS — E538 Deficiency of other specified B group vitamins: Secondary | ICD-10-CM

## 2020-11-12 NOTE — Telephone Encounter (Signed)
Pt called to have future labs ordered inorder for him to have labs drawn in [facility]. Called pt to confirm lab used in [facility], [facility]

## 2020-11-17 ENCOUNTER — Telehealth: Payer: Self-pay

## 2020-11-17 ENCOUNTER — Other Ambulatory Visit: Payer: Self-pay | Admitting: *Deleted

## 2020-11-17 DIAGNOSIS — Z1159 Encounter for screening for other viral diseases: Secondary | ICD-10-CM

## 2020-11-17 DIAGNOSIS — N183 Chronic kidney disease, stage 3 unspecified: Secondary | ICD-10-CM

## 2020-11-17 DIAGNOSIS — R030 Elevated blood-pressure reading, without diagnosis of hypertension: Secondary | ICD-10-CM

## 2020-11-17 DIAGNOSIS — Z13220 Encounter for screening for lipoid disorders: Secondary | ICD-10-CM

## 2020-11-17 DIAGNOSIS — Z136 Encounter for screening for cardiovascular disorders: Secondary | ICD-10-CM | POA: Diagnosis not present

## 2020-11-17 DIAGNOSIS — E038 Other specified hypothyroidism: Secondary | ICD-10-CM

## 2020-11-17 DIAGNOSIS — E538 Deficiency of other specified B group vitamins: Secondary | ICD-10-CM

## 2020-11-17 DIAGNOSIS — M546 Pain in thoracic spine: Secondary | ICD-10-CM | POA: Diagnosis not present

## 2020-11-17 DIAGNOSIS — M542 Cervicalgia: Secondary | ICD-10-CM | POA: Diagnosis not present

## 2020-11-17 NOTE — Telephone Encounter (Signed)
Patient needed lab orders faxed over to [facility] in [facility]

## 2020-11-18 DIAGNOSIS — L570 Actinic keratosis: Secondary | ICD-10-CM | POA: Diagnosis not present

## 2020-11-18 DIAGNOSIS — X32XXXA Exposure to sunlight, initial encounter: Secondary | ICD-10-CM | POA: Diagnosis not present

## 2020-11-18 DIAGNOSIS — B078 Other viral warts: Secondary | ICD-10-CM | POA: Diagnosis not present

## 2020-11-23 ENCOUNTER — Other Ambulatory Visit: Payer: Self-pay

## 2020-11-23 ENCOUNTER — Ambulatory Visit: Payer: 59 | Admitting: Family Medicine

## 2020-11-23 ENCOUNTER — Encounter: Payer: Self-pay | Admitting: Family Medicine

## 2020-11-23 VITALS — BP 138/70 | HR 61 | Temp 98.0°F | Ht 74.0 in | Wt 201.0 lb

## 2020-11-23 DIAGNOSIS — Z0001 Encounter for general adult medical examination with abnormal findings: Secondary | ICD-10-CM

## 2020-11-23 DIAGNOSIS — E538 Deficiency of other specified B group vitamins: Secondary | ICD-10-CM

## 2020-11-23 DIAGNOSIS — N183 Chronic kidney disease, stage 3 unspecified: Secondary | ICD-10-CM

## 2020-11-23 DIAGNOSIS — Z0000 Encounter for general adult medical examination without abnormal findings: Secondary | ICD-10-CM

## 2020-11-23 DIAGNOSIS — E038 Other specified hypothyroidism: Secondary | ICD-10-CM | POA: Diagnosis not present

## 2020-11-23 NOTE — Progress Notes (Signed)
Subjective:    Patient ID: Alex Caldwell, male    DOB: Feb 18, 1961, 60 y.o.   MRN: [id number]  HPI Patient is a very pleasant 60 year old Caucasian male here today for complete physical exam.  He sees his urologist once a year in May and they check his prostate and his PSA at that time.  His last colonoscopy was in 2019 and was perfect.  He is not due for repeat colonoscopy until 2029.  He is currently living in [place].  Recently he had an episode while standing at a store where he became lightheaded.  He reports some vertigo that occurred initially.  He states that he felt like the room was moving.  He then became lightheaded and had to sit down.  The symptoms lasted a few moments and then gradually improved.  There was no syncope.  He denied any chest pain or tachyarrhythmias or palpitations or shortness of breath.  He had recently exercised although he has no history of hypoglycemia.  Differential diagnosis includes vertigo, vertigo complicated by vasovagal episode due to panic, cardiac arrhythmias.  I suspect most likely was a vasovagal episode triggered by mild vertigo.  After talking with the patient he tends to agree based on the way his symptoms felt.  He has had all of his vaccines.  He recently had blood work drawn at [facility] however I do not have that to review today He also recently had an MRI of his cervical spine in November 2021.  The MRI showed loss of the lordotic curvature of the cervical spine.  At C3-C4 there was posterior disc herniation which indents the anterior surface of the thecal sac.  He did have mild spinal canal stenosis.  He had moderate left and mild right neural foraminal stenosis.  At C4-C5 there was a right foraminal disc herniation with severe right neuroforaminal stenosis there was mild left foraminal stenosis at that level.  At C5-C6 there was posterior disc herniation which indented the anterior surface of the thecal sac.  There was some mild bulging disc  below that however the most significant abnormality would be C4-C5. Past Medical History:  Diagnosis Date  . Acromioclavicular joint arthritis    Left shoulder  . DJD (degenerative joint disease)   . Hypertension    Phreesia 11/20/2020  . No pertinent past medical history   . Rotator cuff tear, left    Past Surgical History:  Procedure Laterality Date  . BICEPS TENDON REPAIR  2004   left  . KNEE ARTHROSCOPY W/ ACL RECONSTRUCTION  1995   left  . ROTATOR CUFF REPAIR     Current Outpatient Medications on File Prior to Visit  Medication Sig Dispense Refill  . losartan (COZAAR) 25 MG tablet Take 25 mg by mouth daily.    . traZODone (DESYREL) 50 MG tablet TAKE 1/2 TO 1 TABLET(25 TO 50 MG) BY MOUTH AT BEDTIME AS NEEDED FOR SLEEP 30 tablet 3  . vitamin B-12 (CYANOCOBALAMIN) 1000 MCG tablet Take 1 tablet (1,000 mcg total) by mouth daily. 30 tablet 5   No current facility-administered medications on file prior to visit.   No Known Allergies Social History   Socioeconomic History  . Marital status: Married    Spouse name: Not on file  . Number of children: Not on file  . Years of education: Not on file  . Highest education level: Not on file  Occupational History  . Not on file  Tobacco Use  . Smoking status:  Never Smoker  . Smokeless tobacco: Never Used  Vaping Use  . Vaping Use: Never used  Substance and Sexual Activity  . Alcohol use: Yes    Alcohol/week: 10.0 standard drinks    Types: 10 Standard drinks or equivalent per week  . Drug use: No  . Sexual activity: Yes  Other Topics Concern  . Not on file  Social History Narrative  . Not on file   Social Determinants of Health   Financial Resource Strain: Not on file  Food Insecurity: Not on file  Transportation Needs: Not on file  Physical Activity: Not on file  Stress: Not on file  Social Connections: Not on file  Intimate Partner Violence: Not on file   Family History  Problem Relation Age of Onset  . Cancer  Mother        ovarian  . Hyperlipidemia Father   . Hypertension Father   . Colon cancer Neg Hx   . Esophageal cancer Neg Hx   . Stomach cancer Neg Hx       Review of Systems  All other systems reviewed and are negative.      Objective:   Physical Exam Vitals reviewed.  Constitutional:      General: He is not in acute distress.    Appearance: He is well-developed. He is not diaphoretic.  HENT:     Head: Normocephalic and atraumatic.     Right Ear: External ear normal.     Left Ear: External ear normal.     Nose: Nose normal.     Mouth/Throat:     Pharynx: No oropharyngeal exudate.  Eyes:     General: No scleral icterus.       Right eye: No discharge.        Left eye: No discharge.     Conjunctiva/sclera: Conjunctivae normal.     Pupils: Pupils are equal, round, and reactive to light.  Neck:     Thyroid: No thyromegaly.     Vascular: No JVD.     Trachea: No tracheal deviation.  Cardiovascular:     Rate and Rhythm: Normal rate and regular rhythm.     Heart sounds: Normal heart sounds. No murmur heard. No friction rub. No gallop.   Pulmonary:     Effort: Pulmonary effort is normal. No respiratory distress.     Breath sounds: Normal breath sounds. No stridor. No wheezing or rales.  Chest:     Chest wall: No tenderness.  Abdominal:     General: Bowel sounds are normal. There is no distension.     Palpations: Abdomen is soft. There is no mass.     Tenderness: There is no abdominal tenderness. There is no guarding or rebound.  Musculoskeletal:        General: No tenderness. Normal range of motion.     Cervical back: Normal range of motion and neck supple.  Lymphadenopathy:     Cervical: No cervical adenopathy.  Skin:    General: Skin is warm.     Coloration: Skin is not pale.     Findings: No erythema or rash.  Neurological:     Mental Status: He is alert and oriented to person, place, and time.     Cranial Nerves: No cranial nerve deficit.     Motor: No  abnormal muscle tone.     Coordination: Coordination normal.     Deep Tendon Reflexes: Reflexes are normal and symmetric.  Psychiatric:        Behavior:  Behavior normal.        Thought Content: Thought content normal.        Judgment: Judgment normal.           Assessment & Plan:  Routine general medical examination at a health care facility - Plan: CBC with Differential/Platelet, COMPLETE METABOLIC PANEL WITH GFR, Lipid panel, TSH, Vitamin B12, CANCELED: PSA  Stage 3 chronic kidney disease, unspecified whether stage 3a or 3b CKD (HCC)  Low serum vitamin B12 - Plan: Vitamin B12  Subclinical hypothyroidism - Plan: TSH  I will defer PSA to his urologist.  Colonoscopy is up-to-date.  I would like to check a CBC, CMP, lipid panel.  He has a history of subclinical hypothyroidism so would like to check a TSH and he also has a history of low B12 so would like to check a vitamin B12.  Immunizations are up-to-date.  Blood pressure today is outstanding.  I believe that the patient's episode was most likely a mild vasovagal attack brought on by vertigo.  Patient will monitor and if the symptoms occur again I would recommend a cardiac monitor to rule out arrhythmias

## 2020-12-02 ENCOUNTER — Other Ambulatory Visit: Payer: Self-pay | Admitting: Family Medicine

## 2020-12-02 DIAGNOSIS — R5382 Chronic fatigue, unspecified: Secondary | ICD-10-CM

## 2020-12-07 ENCOUNTER — Other Ambulatory Visit (HOSPITAL_COMMUNITY): Payer: Self-pay | Admitting: Adult Health

## 2020-12-07 DIAGNOSIS — Z125 Encounter for screening for malignant neoplasm of prostate: Secondary | ICD-10-CM | POA: Diagnosis not present

## 2020-12-07 DIAGNOSIS — F5221 Male erectile disorder: Secondary | ICD-10-CM | POA: Diagnosis not present

## 2020-12-07 DIAGNOSIS — N401 Enlarged prostate with lower urinary tract symptoms: Secondary | ICD-10-CM | POA: Diagnosis not present

## 2020-12-07 DIAGNOSIS — N486 Induration penis plastica: Secondary | ICD-10-CM | POA: Diagnosis not present

## 2020-12-07 MED FILL — TADALAFIL 2.5 MG TABS: 2.5 | 30 days supply | Qty: 30 | Fill #0

## 2020-12-21 MED FILL — valACYclovir HCL 1 GM TABS: 1 | 7 days supply | Qty: 14 | Fill #2

## 2020-12-29 MED FILL — TADALAFIL 2.5 MG TABS: 2.5 | 30 days supply | Qty: 30 | Fill #1

## 2021-01-25 MED FILL — valACYclovir HCL 1 GM TABS: 1 | 7 days supply | Qty: 14 | Fill #3

## 2021-02-02 ENCOUNTER — Other Ambulatory Visit (HOSPITAL_COMMUNITY): Payer: Self-pay

## 2021-02-02 ENCOUNTER — Other Ambulatory Visit: Payer: Self-pay

## 2021-02-04 ENCOUNTER — Other Ambulatory Visit (HOSPITAL_COMMUNITY): Payer: Self-pay

## 2021-02-04 MED ORDER — TADALAFIL 2.5 MG PO TABS
2.5000 mg | ORAL_TABLET | Freq: Every day | ORAL | 1 refills | Status: DC
  Filled 2021-02-04: qty 30, 30d supply, fill #0
  Filled 2021-03-03: qty 30, 30d supply, fill #1

## 2021-02-07 ENCOUNTER — Other Ambulatory Visit (HOSPITAL_COMMUNITY): Payer: Self-pay

## 2021-02-20 ENCOUNTER — Other Ambulatory Visit: Payer: Self-pay | Admitting: Family Medicine

## 2021-02-22 ENCOUNTER — Telehealth: Payer: Self-pay | Admitting: Family Medicine

## 2021-02-22 ENCOUNTER — Other Ambulatory Visit (HOSPITAL_COMMUNITY): Payer: Self-pay

## 2021-02-22 MED ORDER — LOSARTAN POTASSIUM 25 MG PO TABS
25.0000 mg | ORAL_TABLET | Freq: Every day | ORAL | 1 refills | Status: DC
  Filled 2021-02-22: qty 90, 90d supply, fill #0
  Filled 2021-05-24: qty 90, 90d supply, fill #1

## 2021-02-22 NOTE — Telephone Encounter (Signed)
ok 

## 2021-02-22 NOTE — Telephone Encounter (Signed)
Prescription sent to pharmacy.

## 2021-02-22 NOTE — Telephone Encounter (Signed)
Request for refill on losartan (COZAAR) 25 MG tablet  He states he last got this refilled at an urgent care but since we have labs done here they state he should have this filled by his PCP.   CB# [phone]

## 2021-02-22 NOTE — Telephone Encounter (Signed)
Ok to take over prescription?

## 2021-03-01 ENCOUNTER — Other Ambulatory Visit (HOSPITAL_COMMUNITY): Payer: Self-pay

## 2021-03-01 ENCOUNTER — Other Ambulatory Visit: Payer: Self-pay | Admitting: Urology

## 2021-03-02 ENCOUNTER — Other Ambulatory Visit (HOSPITAL_COMMUNITY): Payer: Self-pay

## 2021-03-02 ENCOUNTER — Other Ambulatory Visit: Payer: Self-pay | Admitting: Urology

## 2021-03-03 ENCOUNTER — Other Ambulatory Visit (HOSPITAL_COMMUNITY): Payer: Self-pay

## 2021-03-09 ENCOUNTER — Other Ambulatory Visit (HOSPITAL_COMMUNITY): Payer: Self-pay

## 2021-03-09 ENCOUNTER — Other Ambulatory Visit: Payer: Self-pay | Admitting: Urology

## 2021-03-09 DIAGNOSIS — M25562 Pain in left knee: Secondary | ICD-10-CM | POA: Diagnosis not present

## 2021-03-09 MED ORDER — METHYLPREDNISOLONE 4 MG PO TBPK
ORAL_TABLET | ORAL | 0 refills | Status: DC
  Filled 2021-03-09: qty 21, 6d supply, fill #0

## 2021-03-10 ENCOUNTER — Other Ambulatory Visit (HOSPITAL_COMMUNITY): Payer: Self-pay

## 2021-03-10 MED ORDER — VALACYCLOVIR HCL 1 G PO TABS
1.0000 g | ORAL_TABLET | Freq: Two times a day (BID) | ORAL | 3 refills | Status: DC
  Filled 2021-03-10: qty 14, 7d supply, fill #0
  Filled 2021-06-21: qty 14, 7d supply, fill #1

## 2021-03-15 ENCOUNTER — Other Ambulatory Visit (HOSPITAL_COMMUNITY): Payer: Self-pay

## 2021-03-15 DIAGNOSIS — N486 Induration penis plastica: Secondary | ICD-10-CM | POA: Diagnosis not present

## 2021-03-15 DIAGNOSIS — F5221 Male erectile disorder: Secondary | ICD-10-CM | POA: Diagnosis not present

## 2021-03-15 MED ORDER — TADALAFIL 20 MG PO TABS
10.0000 mg | ORAL_TABLET | Freq: Every day | ORAL | 3 refills | Status: DC | PRN
  Filled 2021-03-15: qty 6, 30d supply, fill #0

## 2021-03-16 ENCOUNTER — Other Ambulatory Visit (HOSPITAL_COMMUNITY): Payer: Self-pay

## 2021-04-05 DIAGNOSIS — S83242A Other tear of medial meniscus, current injury, left knee, initial encounter: Secondary | ICD-10-CM | POA: Diagnosis not present

## 2021-04-12 DIAGNOSIS — M25562 Pain in left knee: Secondary | ICD-10-CM | POA: Diagnosis not present

## 2021-05-24 ENCOUNTER — Other Ambulatory Visit (HOSPITAL_COMMUNITY): Payer: Self-pay

## 2021-05-31 ENCOUNTER — Other Ambulatory Visit: Payer: Self-pay

## 2021-05-31 ENCOUNTER — Encounter: Payer: Self-pay | Admitting: Family Medicine

## 2021-05-31 ENCOUNTER — Ambulatory Visit: Payer: 59 | Admitting: Family Medicine

## 2021-05-31 ENCOUNTER — Ambulatory Visit (HOSPITAL_COMMUNITY)
Admission: RE | Admit: 2021-05-31 | Discharge: 2021-05-31 | Disposition: A | Discharge status: Home / Self Care | Payer: 59 | Source: Ambulatory Visit | Attending: Family Medicine | Admitting: Family Medicine

## 2021-05-31 VITALS — BP 130/82 | HR 72 | Temp 98.7°F | Resp 16 | Ht 74.0 in | Wt 197.0 lb

## 2021-05-31 DIAGNOSIS — M25552 Pain in left hip: Secondary | ICD-10-CM

## 2021-05-31 DIAGNOSIS — M545 Low back pain, unspecified: Secondary | ICD-10-CM | POA: Diagnosis not present

## 2021-05-31 DIAGNOSIS — M25551 Pain in right hip: Secondary | ICD-10-CM | POA: Diagnosis not present

## 2021-05-31 NOTE — Progress Notes (Signed)
Subjective:    Patient ID: Alex Caldwell, male    DOB: Jul 30, 1961, 60 y.o.   MRN: [id number]  HPI Patient is a very pleasant 60 year old Caucasian gentleman who presents today with "hip pain".  Patient states that he does not remember any specific injury.  However recently he has been having pain that begins in his posterior hips usually in the gluteus muscles and will radiate around into his groin.  He was having a difficult time even bending over to tie his shoes and put on his socks.  He states that when he lays flat on his back at night, is all he can due to sit up and roll over in bed due to the pain in his lower back.  He was also having some pain radiating into his posterior thighs bilaterally.  Today on exam, he has normal range of motion in both hips.  He has full flexion internal and external rotation without any pain.  He has no tenderness to palpation over the spinous processes of his lumbar spine.  There is no tenderness to palpation in the lumbar paraspinal muscles.  He does report some pain and stiffness in his gluteus muscles bilaterally.  There is no tenderness or pain in his hamstring.  He denies any pain with knee flexion or hip extension. Past Medical History:  Diagnosis Date   Acromioclavicular joint arthritis    Left shoulder   DJD (degenerative joint disease)    Herniated nucleus pulposus, C4-5 right    severe foramenal stenosis on mri 11/21   Hypertension    Phreesia 11/20/2020   No pertinent past medical history    Rotator cuff tear, left    Past Surgical History:  Procedure Laterality Date   BICEPS TENDON REPAIR  2004   left   KNEE ARTHROSCOPY W/ ACL RECONSTRUCTION  1995   left   ROTATOR CUFF REPAIR     Current Outpatient Medications on File Prior to Visit  Medication Sig Dispense Refill   losartan (COZAAR) 25 MG tablet Take 1 tablet (25 mg total) by mouth daily. 90 tablet 1   tadalafil (CIALIS) 20 MG tablet Take 0.5 tablets (10 mg total) by mouth daily as  needed. 30 tablet 3   traZODone (DESYREL) 50 MG tablet TAKE 1/2 TO 1 TABLET(25 TO 50 MG) BY MOUTH AT BEDTIME AS NEEDED FOR SLEEP 30 tablet 3   valACYclovir (VALTREX) 1000 MG tablet Take 1 tablet (1,000 mg total) by mouth 2 (two) times daily. 14 tablet 3   vitamin B-12 (CYANOCOBALAMIN) 1000 MCG tablet Take 1 tablet (1,000 mcg total) by mouth daily. 30 tablet 5   No current facility-administered medications on file prior to visit.   No Known Allergies Social History   Socioeconomic History   Marital status: Married    Spouse name: Not on file   Number of children: Not on file   Years of education: Not on file   Highest education level: Not on file  Occupational History   Not on file  Tobacco Use   Smoking status: Never   Smokeless tobacco: Never  Vaping Use   Vaping Use: Never used  Substance and Sexual Activity   Alcohol use: Yes    Alcohol/week: 10.0 standard drinks    Types: 10 Standard drinks or equivalent per week   Drug use: No   Sexual activity: Yes  Other Topics Concern   Not on file  Social History Narrative   Not on file   Social Determinants  of Health   Financial Resource Strain: Not on file  Food Insecurity: Not on file  Transportation Needs: Not on file  Physical Activity: Not on file  Stress: Not on file  Social Connections: Not on file  Intimate Partner Violence: Not on file      Review of Systems  All other systems reviewed and are negative.     Objective:   Physical Exam Vitals reviewed.  Constitutional:      Appearance: Normal appearance. He is normal weight.  Cardiovascular:     Rate and Rhythm: Normal rate and regular rhythm.     Pulses: Normal pulses.     Heart sounds: Normal heart sounds.  Pulmonary:     Effort: Pulmonary effort is normal.     Breath sounds: Normal breath sounds.  Musculoskeletal:     Lumbar back: No swelling, deformity, spasms, tenderness or bony tenderness. Decreased range of motion.       Back:     Right lower  leg: No edema.     Left lower leg: No edema.       Legs:  Neurological:     Mental Status: He is alert.          Assessment & Plan:  Hip pain, bilateral - Plan: CBC with Differential/Platelet, COMPLETE METABOLIC PANEL WITH GFR, Rheumatoid factor, Sedimentation rate, DG HIP UNILAT WITH PELVIS 2-3 VIEWS RIGHT, DG Lumbar Spine Complete  I believe there is 3 possibilities on the differential diagnosis.  #1, the patient could have degenerative disc disease in the lumbar spine with radiating pain into his pelvis and posterior hips.  #2 he could have osteoarthritis in the hips.  #3 it could be muscle stiffness in the gluteus muscles and in the hamstrings.  I would lean towards the latter.  I will begin by obtaining an x-ray of the lumbar spine and his right hip since that is the worst pain that he is experiencing.  If the x-rays are normal, and the lab work shows no elevated sed rate and no rheumatoid factor, I would recommend physical therapy and muscle relaxers to treat muscle stiffness in the hip.  If there is severe degenerative disc disease in the lumbar spine, this could be referred pain and then the question will be whether the patient would benefit from epidural steroid injections in the future.  I do not believe that is due to osteoarthritis in the hip based on his exam today

## 2021-06-01 LAB — COMPLETE METABOLIC PANEL WITH GFR
AG Ratio: 1.8 (calc) (ref 1.0–2.5)
ALT: 20 U/L (ref 9–46)
AST: 21 U/L (ref 10–35)
Albumin: 4.3 g/dL (ref 3.6–5.1)
Alkaline phosphatase (APISO): 54 U/L (ref 35–144)
BUN: 22 mg/dL (ref 7–25)
CO2: 28 mmol/L (ref 20–32)
Calcium: 9.4 mg/dL (ref 8.6–10.3)
Chloride: 102 mmol/L (ref 98–110)
Creat: 1.25 mg/dL (ref 0.70–1.30)
Globulin: 2.4 g/dL (calc) (ref 1.9–3.7)
Glucose, Bld: 95 mg/dL (ref 65–99)
Potassium: 4.3 mmol/L (ref 3.5–5.3)
Sodium: 138 mmol/L (ref 135–146)
Total Bilirubin: 0.4 mg/dL (ref 0.2–1.2)
Total Protein: 6.7 g/dL (ref 6.1–8.1)
eGFR: 66 mL/min/{1.73_m2} (ref >=60)

## 2021-06-01 LAB — CBC WITH DIFFERENTIAL/PLATELET
Absolute Monocytes: 475 cells/uL (ref 200–950)
Basophils Absolute: 39 cells/uL (ref 0–200)
Basophils Relative: 0.6 %
Eosinophils Absolute: 124 cells/uL (ref 15–500)
Eosinophils Relative: 1.9 %
HCT: 43.3 % (ref 38.5–50.0)
Hemoglobin: 14.5 g/dL (ref 13.2–17.1)
Lymphs Abs: 1697 cells/uL (ref 850–3900)
MCH: 29.7 pg (ref 27.0–33.0)
MCHC: 33.5 g/dL (ref 32.0–36.0)
MCV: 88.7 fL (ref 80.0–100.0)
MPV: 9.6 fL (ref 7.5–12.5)
Monocytes Relative: 7.3 %
Neutro Abs: 4167 cells/uL (ref 1500–7800)
Neutrophils Relative %: 64.1 %
Platelets: 240 10*3/uL (ref 140–400)
RBC: 4.88 10*6/uL (ref 4.20–5.80)
RDW: 12.5 % (ref 11.0–15.0)
Total Lymphocyte: 26.1 %
WBC: 6.5 10*3/uL (ref 3.8–10.8)

## 2021-06-01 LAB — RHEUMATOID FACTOR: Rheumatoid fact SerPl-aCnc: <14 IU/mL (ref <14)

## 2021-06-01 LAB — SEDIMENTATION RATE: Sed Rate: 6 mm/h (ref 0–20)

## 2021-06-03 ENCOUNTER — Other Ambulatory Visit: Payer: Self-pay | Admitting: *Deleted

## 2021-06-03 ENCOUNTER — Telehealth: Payer: Self-pay

## 2021-06-03 ENCOUNTER — Other Ambulatory Visit (HOSPITAL_COMMUNITY): Payer: Self-pay

## 2021-06-03 DIAGNOSIS — M25552 Pain in left hip: Secondary | ICD-10-CM

## 2021-06-03 DIAGNOSIS — M51369 Other intervertebral disc degeneration, lumbar region without mention of lumbar back pain or lower extremity pain: Secondary | ICD-10-CM

## 2021-06-03 DIAGNOSIS — M5136 Other intervertebral disc degeneration, lumbar region: Secondary | ICD-10-CM

## 2021-06-03 DIAGNOSIS — M25551 Pain in right hip: Secondary | ICD-10-CM

## 2021-06-03 MED ORDER — MELOXICAM 7.5 MG PO TABS
7.5000 mg | ORAL_TABLET | ORAL | 1 refills | Status: DC | PRN
  Filled 2021-06-03: qty 30, 30d supply, fill #0
  Filled 2021-06-21: qty 30, 30d supply, fill #1

## 2021-06-03 NOTE — Telephone Encounter (Signed)
Pt called in wanting to discuss the results of his lab work and x-ray results. Please advise  Cb#: [phone]

## 2021-06-03 NOTE — Telephone Encounter (Signed)
Please see labs for further documentation.  

## 2021-06-06 ENCOUNTER — Other Ambulatory Visit (HOSPITAL_COMMUNITY): Payer: Self-pay

## 2021-06-07 ENCOUNTER — Other Ambulatory Visit (HOSPITAL_COMMUNITY): Payer: Self-pay

## 2021-06-13 DIAGNOSIS — M545 Low back pain, unspecified: Secondary | ICD-10-CM | POA: Diagnosis not present

## 2021-06-13 DIAGNOSIS — M25551 Pain in right hip: Secondary | ICD-10-CM | POA: Diagnosis not present

## 2021-06-13 DIAGNOSIS — M25552 Pain in left hip: Secondary | ICD-10-CM | POA: Diagnosis not present

## 2021-06-14 ENCOUNTER — Telehealth: Payer: Self-pay | Admitting: *Deleted

## 2021-06-14 DIAGNOSIS — M5136 Other intervertebral disc degeneration, lumbar region: Secondary | ICD-10-CM

## 2021-06-14 DIAGNOSIS — M25551 Pain in right hip: Secondary | ICD-10-CM

## 2021-06-14 NOTE — Telephone Encounter (Signed)
Received call from patient, [phone]~ telephone.   Patient reports that he has been having increased pain in back and hip. States that he has been using Mobic with no relief. States that he has going to PT in [facility] with no relief.   States that he had obtained Toradol injection from UC and had x4 days of relief prior to seeing PCP. Inquired if he could get another injection.   Advised that PC has no appointments for today at this time.   Please advise.

## 2021-06-14 NOTE — Telephone Encounter (Signed)
Call placed to patient and patient made aware.   Appointment scheduled.  

## 2021-06-15 ENCOUNTER — Other Ambulatory Visit: Payer: Self-pay

## 2021-06-15 ENCOUNTER — Ambulatory Visit (INDEPENDENT_AMBULATORY_CARE_PROVIDER_SITE_OTHER): Payer: 59 | Admitting: *Deleted

## 2021-06-15 DIAGNOSIS — M25551 Pain in right hip: Secondary | ICD-10-CM

## 2021-06-15 DIAGNOSIS — M5136 Other intervertebral disc degeneration, lumbar region: Secondary | ICD-10-CM

## 2021-06-15 DIAGNOSIS — M25552 Pain in left hip: Secondary | ICD-10-CM | POA: Diagnosis not present

## 2021-06-15 MED ORDER — KETOROLAC TROMETHAMINE 60 MG/2ML IM SOLN
60.0000 mg | Freq: Once | INTRAMUSCULAR | Status: AC
Start: 2021-06-15 — End: 2021-06-15
  Administered 2021-06-15: 60 mg via INTRAMUSCULAR

## 2021-06-15 NOTE — Telephone Encounter (Signed)
Patient in office and injection given.   States that he would like to go ahead and order MRI of L Spine.   Orders placed.

## 2021-06-15 NOTE — Addendum Note (Signed)
Addended by: Sheral Flow on: 06/15/2021 10:12 AM   Modules accepted: Orders

## 2021-06-20 DIAGNOSIS — M25551 Pain in right hip: Secondary | ICD-10-CM | POA: Diagnosis not present

## 2021-06-20 DIAGNOSIS — M545 Low back pain, unspecified: Secondary | ICD-10-CM | POA: Diagnosis not present

## 2021-06-20 DIAGNOSIS — M25552 Pain in left hip: Secondary | ICD-10-CM | POA: Diagnosis not present

## 2021-06-21 ENCOUNTER — Ambulatory Visit
Admission: RE | Admit: 2021-06-21 | Discharge: 2021-06-21 | Disposition: A | Discharge status: Home / Self Care | Payer: 59 | Source: Ambulatory Visit | Attending: Family Medicine | Admitting: Family Medicine

## 2021-06-21 ENCOUNTER — Other Ambulatory Visit: Payer: Self-pay

## 2021-06-21 ENCOUNTER — Other Ambulatory Visit (HOSPITAL_COMMUNITY): Payer: Self-pay

## 2021-06-21 DIAGNOSIS — M47817 Spondylosis without myelopathy or radiculopathy, lumbosacral region: Secondary | ICD-10-CM | POA: Diagnosis not present

## 2021-06-21 DIAGNOSIS — M5136 Other intervertebral disc degeneration, lumbar region: Secondary | ICD-10-CM

## 2021-06-21 DIAGNOSIS — M25551 Pain in right hip: Secondary | ICD-10-CM

## 2021-06-21 DIAGNOSIS — M5126 Other intervertebral disc displacement, lumbar region: Secondary | ICD-10-CM | POA: Diagnosis not present

## 2021-06-22 ENCOUNTER — Telehealth: Payer: Self-pay | Admitting: *Deleted

## 2021-06-22 NOTE — Telephone Encounter (Signed)
Received call from patient.   Reports that he continues to have increased pain in back/ hip. Reports that he has had x2 visits to PT with minimal relief.   Inquired if Prednisone Taper would be beneficial.  Please advise.

## 2021-06-23 ENCOUNTER — Other Ambulatory Visit: Payer: Self-pay | Admitting: Family Medicine

## 2021-06-23 MED ORDER — PREDNISONE 20 MG PO TABS
ORAL_TABLET | ORAL | 0 refills | Status: DC
  Filled 2021-06-28: qty 12, 6d supply, fill #0

## 2021-06-23 NOTE — Telephone Encounter (Signed)
Call placed to patient and patient made aware.   Patient states that he would like to continue PT with Pred Taper and then decide if he needs referral.   Advised to call back if he would like to proceed.

## 2021-06-23 NOTE — Telephone Encounter (Signed)
Call placed to patient. LMTRC.  

## 2021-06-24 DIAGNOSIS — M25551 Pain in right hip: Secondary | ICD-10-CM | POA: Diagnosis not present

## 2021-06-24 DIAGNOSIS — M545 Low back pain, unspecified: Secondary | ICD-10-CM | POA: Diagnosis not present

## 2021-06-24 DIAGNOSIS — M25552 Pain in left hip: Secondary | ICD-10-CM | POA: Diagnosis not present

## 2021-06-27 DIAGNOSIS — M545 Low back pain, unspecified: Secondary | ICD-10-CM | POA: Diagnosis not present

## 2021-06-27 DIAGNOSIS — M25552 Pain in left hip: Secondary | ICD-10-CM | POA: Diagnosis not present

## 2021-06-27 DIAGNOSIS — M25551 Pain in right hip: Secondary | ICD-10-CM | POA: Diagnosis not present

## 2021-06-28 ENCOUNTER — Other Ambulatory Visit (HOSPITAL_COMMUNITY): Payer: Self-pay

## 2021-07-01 ENCOUNTER — Other Ambulatory Visit: Payer: Self-pay | Admitting: Family Medicine

## 2021-07-01 DIAGNOSIS — M545 Low back pain, unspecified: Secondary | ICD-10-CM | POA: Diagnosis not present

## 2021-07-01 DIAGNOSIS — M25552 Pain in left hip: Secondary | ICD-10-CM | POA: Diagnosis not present

## 2021-07-01 DIAGNOSIS — M25551 Pain in right hip: Secondary | ICD-10-CM | POA: Diagnosis not present

## 2021-07-05 DIAGNOSIS — M545 Low back pain, unspecified: Secondary | ICD-10-CM | POA: Diagnosis not present

## 2021-07-05 DIAGNOSIS — M25552 Pain in left hip: Secondary | ICD-10-CM | POA: Diagnosis not present

## 2021-07-05 DIAGNOSIS — M25551 Pain in right hip: Secondary | ICD-10-CM | POA: Diagnosis not present

## 2021-07-08 DIAGNOSIS — M25551 Pain in right hip: Secondary | ICD-10-CM | POA: Diagnosis not present

## 2021-07-08 DIAGNOSIS — M25552 Pain in left hip: Secondary | ICD-10-CM | POA: Diagnosis not present

## 2021-07-08 DIAGNOSIS — M545 Low back pain, unspecified: Secondary | ICD-10-CM | POA: Diagnosis not present

## 2021-07-11 ENCOUNTER — Other Ambulatory Visit (HOSPITAL_COMMUNITY): Payer: Self-pay

## 2021-07-11 DIAGNOSIS — M25552 Pain in left hip: Secondary | ICD-10-CM | POA: Diagnosis not present

## 2021-07-11 DIAGNOSIS — M545 Low back pain, unspecified: Secondary | ICD-10-CM | POA: Diagnosis not present

## 2021-07-11 DIAGNOSIS — L089 Local infection of the skin and subcutaneous tissue, unspecified: Secondary | ICD-10-CM | POA: Diagnosis not present

## 2021-07-11 DIAGNOSIS — L570 Actinic keratosis: Secondary | ICD-10-CM | POA: Diagnosis not present

## 2021-07-11 DIAGNOSIS — M25551 Pain in right hip: Secondary | ICD-10-CM | POA: Diagnosis not present

## 2021-07-11 DIAGNOSIS — B001 Herpesviral vesicular dermatitis: Secondary | ICD-10-CM | POA: Diagnosis not present

## 2021-07-11 DIAGNOSIS — R208 Other disturbances of skin sensation: Secondary | ICD-10-CM | POA: Diagnosis not present

## 2021-07-11 MED ORDER — ACYCLOVIR 400 MG PO TABS
400.0000 mg | ORAL_TABLET | Freq: Two times a day (BID) | ORAL | 0 refills | Status: DC
  Filled 2021-07-11: qty 14, 7d supply, fill #0

## 2021-07-15 DIAGNOSIS — M25552 Pain in left hip: Secondary | ICD-10-CM | POA: Diagnosis not present

## 2021-07-15 DIAGNOSIS — M545 Low back pain, unspecified: Secondary | ICD-10-CM | POA: Diagnosis not present

## 2021-07-15 DIAGNOSIS — M25551 Pain in right hip: Secondary | ICD-10-CM | POA: Diagnosis not present

## 2021-07-18 DIAGNOSIS — M545 Low back pain, unspecified: Secondary | ICD-10-CM | POA: Diagnosis not present

## 2021-07-18 DIAGNOSIS — M25552 Pain in left hip: Secondary | ICD-10-CM | POA: Diagnosis not present

## 2021-07-18 DIAGNOSIS — M25551 Pain in right hip: Secondary | ICD-10-CM | POA: Diagnosis not present

## 2021-07-19 ENCOUNTER — Ambulatory Visit: Payer: 59 | Admitting: Sports Medicine

## 2021-07-19 ENCOUNTER — Other Ambulatory Visit: Payer: Self-pay

## 2021-07-19 ENCOUNTER — Encounter: Payer: Self-pay | Admitting: Sports Medicine

## 2021-07-19 DIAGNOSIS — M47816 Spondylosis without myelopathy or radiculopathy, lumbar region: Secondary | ICD-10-CM

## 2021-07-19 NOTE — Progress Notes (Signed)
PCP: Susy Frizzle, MD  Subjective:   HPI: Patient is a 60 y.o. male here for bilateral hip pain & low back pain.  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  Patient states that he has been able to manage his back pain by limiting his activities and taking ibuprofen.  He states that he suffered renal damage from taking some ibuprofen so has been working with his physician to enroll in physical therapy to help with back pain.  He reports that his pain is greater in his left lower back.  He states that he has limited flexibility on his side.  He denies any radiculopathy symptoms.  He also reports pain back seems to radiate to his hip joints bilaterally.  Again the left is worse than right.  He states that he has also noticed pain and tightness in his hamstrings.  Pain is worse when he flexes his knees.  Patient states he often has trouble standing up from being seated and has pain in his hamstrings at night to the point that he is unable to bend his legs.  In the past he has had a Toradol shot it did help his pain tremendously.  He also reports complete relief when he has prednisone courses.  Patient has been able to undergo MRI.  Show some facet arthropathy and a bulging disc.  He states that he feels relatively well today after having prednisone a few days ago.  Patient states that he is continuing to work with physical therapy.       Objective:  Physical Exam:  Gen: awake, alert, NAD, comfortable in exam room Pulm: breathing unlabored  Hip:  - Inspection: No gross deformity - Palpation: No TTP, specifically none over greater trochanter bilaterally  - ROM: Normal range of motion on Flexion, extension, abduction, internal and external rotation - Strength: Normal strength. - Neuro/vasc: NV intact distally - Special Tests: Negative FABER and FADIR. Negative  Trendelenberg.      Assessment & Plan:    Facet arthropathy, lumbar Imaging shows facet arthropathy that appears to be consistent with patient's description of his pain.  Patient reports improvement with physical therapy so advised that he continue with his physical therapy with specific mention of facet arthropathy as potential contributor as well with noting his bulging disc.  Patient advised to use NSAIDs as needed as he reports these have been helpful in the past.  Patient counseled on importance of limiting NSAID use to avoid any kidney injury.  Also discussed if pain increases or returns, considering facet versus disc steroid injection with fluoroscopy for diagnostic purposes Patient counseled to discontinue meloxicam if he does not feel he is getting any benefit from this but to definitely avoid taking NSAIDs and meloxicam together, patient was understanding Follow-up as needed after continue trial of physical therapy& NSAIDs   No orders of the defined types were placed in this encounter.   No orders of the defined types were placed in this encounter.   Eulis Foster, MD [place], PGY-3 07/19/2021 2:25 PM   Patient seen and evaluated with the resident.  I agree with the above plan of care.  Personally reviewed this patient's MRI and he does have facet arthropathy at both L4-L5 and L5-S1.  His symptoms seem to be consistent with this.  Since he is feeling better today he  will simply continue with physical therapy.  If his pain once again worsens then I will refer him to [facility] imaging for diagnostic/therapeutic facet injections.  Of note he also has moderate disc space narrowing at L2-L3 although I do not see any significant neuroforaminal narrowing at this level it may be worth considering a diagnostic ESI down the road if he does not have a favorable response to facet injections.

## 2021-07-19 NOTE — Assessment & Plan Note (Signed)
Imaging shows facet arthropathy that appears to be consistent with patient's description of his pain.  Patient reports improvement with physical therapy so advised that he continue with his physical therapy with specific mention of facet arthropathy as potential contributor as well with noting his bulging disc.  Patient advised to use NSAIDs as needed as he reports these have been helpful in the past.  Patient counseled on importance of limiting NSAID use to avoid any kidney injury.  Also discussed if pain increases or returns, considering facet versus disc steroid injection with fluoroscopy for diagnostic purposes Patient counseled to discontinue meloxicam if he does not feel he is getting any benefit from this but to definitely avoid taking NSAIDs and meloxicam together, patient was understanding Follow-up as needed after continue trial of physical therapy& NSAIDs

## 2021-07-26 ENCOUNTER — Ambulatory Visit (INDEPENDENT_AMBULATORY_CARE_PROVIDER_SITE_OTHER): Payer: 59 | Admitting: *Deleted

## 2021-07-26 ENCOUNTER — Other Ambulatory Visit: Payer: Self-pay

## 2021-07-26 DIAGNOSIS — Z23 Encounter for immunization: Secondary | ICD-10-CM

## 2021-08-01 DIAGNOSIS — M25552 Pain in left hip: Secondary | ICD-10-CM | POA: Diagnosis not present

## 2021-08-01 DIAGNOSIS — M545 Low back pain, unspecified: Secondary | ICD-10-CM | POA: Diagnosis not present

## 2021-08-01 DIAGNOSIS — M25551 Pain in right hip: Secondary | ICD-10-CM | POA: Diagnosis not present

## 2021-08-04 DIAGNOSIS — M545 Low back pain, unspecified: Secondary | ICD-10-CM | POA: Diagnosis not present

## 2021-08-04 DIAGNOSIS — M25551 Pain in right hip: Secondary | ICD-10-CM | POA: Diagnosis not present

## 2021-08-04 DIAGNOSIS — M25552 Pain in left hip: Secondary | ICD-10-CM | POA: Diagnosis not present

## 2021-08-31 ENCOUNTER — Other Ambulatory Visit (HOSPITAL_COMMUNITY): Payer: Self-pay

## 2021-08-31 ENCOUNTER — Other Ambulatory Visit: Payer: Self-pay | Admitting: Family Medicine

## 2021-08-31 MED ORDER — LOSARTAN POTASSIUM 25 MG PO TABS
25.0000 mg | ORAL_TABLET | Freq: Every day | ORAL | 1 refills | Status: DC
  Filled 2021-08-31: qty 90, 90d supply, fill #0
  Filled 2021-11-29: qty 90, 90d supply, fill #1

## 2021-10-13 DIAGNOSIS — L570 Actinic keratosis: Secondary | ICD-10-CM | POA: Diagnosis not present

## 2021-10-13 DIAGNOSIS — D485 Neoplasm of uncertain behavior of skin: Secondary | ICD-10-CM | POA: Diagnosis not present

## 2021-10-13 DIAGNOSIS — X32XXXA Exposure to sunlight, initial encounter: Secondary | ICD-10-CM | POA: Diagnosis not present

## 2021-10-13 DIAGNOSIS — Z85828 Personal history of other malignant neoplasm of skin: Secondary | ICD-10-CM | POA: Diagnosis not present

## 2021-10-13 DIAGNOSIS — Z08 Encounter for follow-up examination after completed treatment for malignant neoplasm: Secondary | ICD-10-CM | POA: Diagnosis not present

## 2021-11-01 ENCOUNTER — Other Ambulatory Visit: Payer: Self-pay | Admitting: Family Medicine

## 2021-11-07 DIAGNOSIS — X32XXXS Exposure to sunlight, sequela: Secondary | ICD-10-CM | POA: Diagnosis not present

## 2021-11-07 DIAGNOSIS — L570 Actinic keratosis: Secondary | ICD-10-CM | POA: Diagnosis not present

## 2021-11-11 DIAGNOSIS — B09 Unspecified viral infection characterized by skin and mucous membrane lesions: Secondary | ICD-10-CM | POA: Diagnosis not present

## 2021-11-16 ENCOUNTER — Other Ambulatory Visit: Payer: 59

## 2021-11-17 ENCOUNTER — Other Ambulatory Visit: Payer: Self-pay

## 2021-11-17 ENCOUNTER — Other Ambulatory Visit: Payer: 59

## 2021-11-17 DIAGNOSIS — Z136 Encounter for screening for cardiovascular disorders: Secondary | ICD-10-CM

## 2021-11-17 DIAGNOSIS — Z125 Encounter for screening for malignant neoplasm of prostate: Secondary | ICD-10-CM | POA: Diagnosis not present

## 2021-11-17 DIAGNOSIS — Z13220 Encounter for screening for lipoid disorders: Secondary | ICD-10-CM

## 2021-11-17 DIAGNOSIS — Z0000 Encounter for general adult medical examination without abnormal findings: Secondary | ICD-10-CM

## 2021-11-17 DIAGNOSIS — R5382 Chronic fatigue, unspecified: Secondary | ICD-10-CM | POA: Diagnosis not present

## 2021-11-18 LAB — CBC WITH DIFFERENTIAL/PLATELET
Absolute Monocytes: 493 cells/uL (ref 200–950)
Basophils Absolute: 42 cells/uL (ref 0–200)
Basophils Relative: 0.8 %
Eosinophils Absolute: 111 cells/uL (ref 15–500)
Eosinophils Relative: 2.1 %
HCT: 45.7 % (ref 38.5–50.0)
Hemoglobin: 15.4 g/dL (ref 13.2–17.1)
Lymphs Abs: 1776 cells/uL (ref 850–3900)
MCH: 29.4 pg (ref 27.0–33.0)
MCHC: 33.7 g/dL (ref 32.0–36.0)
MCV: 87.2 fL (ref 80.0–100.0)
MPV: 9.7 fL (ref 7.5–12.5)
Monocytes Relative: 9.3 %
Neutro Abs: 2878 cells/uL (ref 1500–7800)
Neutrophils Relative %: 54.3 %
Platelets: 214 10*3/uL (ref 140–400)
RBC: 5.24 10*6/uL (ref 4.20–5.80)
RDW: 12.8 % (ref 11.0–15.0)
Total Lymphocyte: 33.5 %
WBC: 5.3 10*3/uL (ref 3.8–10.8)

## 2021-11-18 LAB — LIPID PANEL
Cholesterol: 186 mg/dL (ref <200)
HDL: 60 mg/dL (ref >=40)
LDL Cholesterol (Calc): 108 mg/dL (calc) — ABNORMAL HIGH
Non-HDL Cholesterol (Calc): 126 mg/dL (calc) (ref <130)
Total CHOL/HDL Ratio: 3.1 (calc) (ref <5.0)
Triglycerides: 88 mg/dL (ref <150)

## 2021-11-18 LAB — COMPREHENSIVE METABOLIC PANEL
AG Ratio: 2 (calc) (ref 1.0–2.5)
ALT: 20 U/L (ref 9–46)
AST: 13 U/L (ref 10–35)
Albumin: 4.3 g/dL (ref 3.6–5.1)
Alkaline phosphatase (APISO): 47 U/L (ref 35–144)
BUN: 18 mg/dL (ref 7–25)
CO2: 33 mmol/L — ABNORMAL HIGH (ref 20–32)
Calcium: 9.4 mg/dL (ref 8.6–10.3)
Chloride: 103 mmol/L (ref 98–110)
Creat: 1.18 mg/dL (ref 0.70–1.35)
Globulin: 2.1 g/dL (calc) (ref 1.9–3.7)
Glucose, Bld: 99 mg/dL (ref 65–99)
Potassium: 4.6 mmol/L (ref 3.5–5.3)
Sodium: 140 mmol/L (ref 135–146)
Total Bilirubin: 0.6 mg/dL (ref 0.2–1.2)
Total Protein: 6.4 g/dL (ref 6.1–8.1)

## 2021-11-18 LAB — TESTOSTERONE TOTAL,FREE,BIO, MALES
Albumin: 4.3 g/dL (ref 3.6–5.1)
Sex Hormone Binding: 52 nmol/L (ref 22–77)
Testosterone, Bioavailable: 135.7 ng/dL (ref 110.0–575.0)
Testosterone, Free: 68.9 pg/mL (ref 46.0–224.0)
Testosterone: 714 ng/dL (ref 250–827)

## 2021-11-18 LAB — PSA: PSA: 0.41 ng/mL (ref <=4.00)

## 2021-11-28 ENCOUNTER — Other Ambulatory Visit (HOSPITAL_COMMUNITY): Payer: Self-pay

## 2021-11-28 DIAGNOSIS — Z85828 Personal history of other malignant neoplasm of skin: Secondary | ICD-10-CM | POA: Diagnosis not present

## 2021-11-28 DIAGNOSIS — Z08 Encounter for follow-up examination after completed treatment for malignant neoplasm: Secondary | ICD-10-CM | POA: Diagnosis not present

## 2021-11-28 DIAGNOSIS — B001 Herpesviral vesicular dermatitis: Secondary | ICD-10-CM | POA: Diagnosis not present

## 2021-11-28 DIAGNOSIS — R208 Other disturbances of skin sensation: Secondary | ICD-10-CM | POA: Diagnosis not present

## 2021-11-28 DIAGNOSIS — L570 Actinic keratosis: Secondary | ICD-10-CM | POA: Diagnosis not present

## 2021-11-28 MED ORDER — ACYCLOVIR 400 MG PO TABS
400.0000 mg | ORAL_TABLET | Freq: Every day | ORAL | 0 refills | Status: DC
  Filled 2021-11-28: qty 50, 10d supply, fill #0

## 2021-11-29 ENCOUNTER — Other Ambulatory Visit: Payer: Self-pay

## 2021-11-29 ENCOUNTER — Other Ambulatory Visit (HOSPITAL_COMMUNITY): Payer: Self-pay

## 2021-11-29 ENCOUNTER — Encounter: Payer: Self-pay | Admitting: Family Medicine

## 2021-11-29 ENCOUNTER — Ambulatory Visit (INDEPENDENT_AMBULATORY_CARE_PROVIDER_SITE_OTHER): Payer: 59 | Admitting: Family Medicine

## 2021-11-29 VITALS — BP 144/82 | HR 60 | Temp 97.9°F | Resp 18 | Ht 74.0 in | Wt 199.0 lb

## 2021-11-29 DIAGNOSIS — Z136 Encounter for screening for cardiovascular disorders: Secondary | ICD-10-CM | POA: Diagnosis not present

## 2021-11-29 DIAGNOSIS — Z0001 Encounter for general adult medical examination with abnormal findings: Secondary | ICD-10-CM

## 2021-11-29 DIAGNOSIS — Z125 Encounter for screening for malignant neoplasm of prostate: Secondary | ICD-10-CM

## 2021-11-29 DIAGNOSIS — E038 Other specified hypothyroidism: Secondary | ICD-10-CM | POA: Diagnosis not present

## 2021-11-29 DIAGNOSIS — Z0000 Encounter for general adult medical examination without abnormal findings: Secondary | ICD-10-CM

## 2021-11-29 DIAGNOSIS — Z13220 Encounter for screening for lipoid disorders: Secondary | ICD-10-CM | POA: Diagnosis not present

## 2021-11-29 DIAGNOSIS — N183 Chronic kidney disease, stage 3 unspecified: Secondary | ICD-10-CM | POA: Diagnosis not present

## 2021-11-29 MED ORDER — CYANOCOBALAMIN 1000 MCG/ML IJ SOLN
1000.0000 ug | INTRAMUSCULAR | 0 refills | Status: DC
  Filled 2021-11-29: qty 3, 90d supply, fill #0
  Filled 2022-03-22: qty 3, 90d supply, fill #1
  Filled 2022-06-19: qty 3, 90d supply, fill #2
  Filled 2022-09-25: qty 3, 90d supply, fill #3

## 2021-11-29 NOTE — Progress Notes (Signed)
Subjective:    Patient ID: Alex Caldwell, male    DOB: 03/04/1961, 61 y.o.   MRN: [id number]  HPI Patient is a very pleasant 61 year old Caucasian male here today for complete physical exam.  Overall he has been doing very well.  His last colonoscopy was in 2019 and showed only hemorrhoids.  He is not due for colonoscopy until 2029.  His PSA was recently performed and was excellent at 0.4.  He denies any lower urinary tract symptoms.  He has had the shingles vaccine.  His flu shot is up-to-date.  He recently had COVID and positive COVID vaccination.  His most recent lab work is listed below. 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     losartan (COZAAR) 25 MG tablet Take 1 tablet (25 mg total) by mouth daily. 90 tablet 1   tadalafil (CIALIS) 20 MG tablet Take 0.5 tablets (10 mg total) by mouth daily as needed. 30 tablet 3   traZODone (DESYREL) 50 MG tablet TAKE 1/2 TO 1 TABLET(25 TO 50 MG) BY MOUTH AT BEDTIME AS NEEDED FOR SLEEP 30 tablet 3   triamcinolone cream (KENALOG) 0.1 % Apply 1 application topically 3 (three) times daily.     valACYclovir (VALTREX) 1000 MG tablet Take 1 tablet (1,000 mg total) by mouth 2 (two) times daily. 14 tablet 3   meloxicam (MOBIC) 7.5 MG tablet Take 1 tablet (7.5 mg total) by mouth as needed for pain. (Patient not  taking: Reported on 11/29/2021) 30 tablet 1   No current facility-administered medications on file prior to visit.   No Known Allergies Social History   Socioeconomic History   Marital status: Married    Spouse name: Not on file   Number of children: Not on file   Years of education: Not on file   Highest education level: Not on file  Occupational History   Not on file  Tobacco Use   Smoking status: Never   Smokeless tobacco: Never  Vaping Use   Vaping Use: Never used  Substance and Sexual Activity   Alcohol use: Yes    Alcohol/week: 10.0 standard drinks    Types: 10 Standard drinks or equivalent per week   Drug use: No   Sexual activity: Yes  Other Topics Concern   Not on file  Social History Narrative   Not on file   Social Determinants of Health   Financial Resource Strain: Not on file  Food Insecurity: Not on file  Transportation Needs: Not on file  Physical Activity: Not on file  Stress: Not on file  Social Connections: Not on file  Intimate Partner Violence: Not on file   Family History  Problem Relation Age of Onset   Cancer Mother  ovarian   Hyperlipidemia Father    Hypertension Father    Colon cancer Neg Hx    Esophageal cancer Neg Hx    Stomach cancer Neg Hx       Review of Systems  All other systems reviewed and are negative.     Objective:   Physical Exam Vitals reviewed.  Constitutional:      General: He is not in acute distress.    Appearance: He is well-developed. He is not diaphoretic.  HENT:     Head: Normocephalic and atraumatic.     Right Ear: External ear normal.     Left Ear: External ear normal.     Nose: Nose normal.     Mouth/Throat:     Pharynx: No oropharyngeal exudate.  Eyes:     General: No scleral icterus.       Right eye: No discharge.        Left eye: No discharge.     Conjunctiva/sclera: Conjunctivae normal.     Pupils: Pupils are equal, round, and reactive to light.  Neck:     Thyroid: No thyromegaly.      Vascular: No JVD.     Trachea: No tracheal deviation.  Cardiovascular:     Rate and Rhythm: Normal rate and regular rhythm.     Heart sounds: Normal heart sounds. No murmur heard.   No friction rub. No gallop.  Pulmonary:     Effort: Pulmonary effort is normal. No respiratory distress.     Breath sounds: Normal breath sounds. No stridor. No wheezing or rales.  Chest:     Chest wall: No tenderness.  Abdominal:     General: Bowel sounds are normal. There is no distension.     Palpations: Abdomen is soft. There is no mass.     Tenderness: There is no abdominal tenderness. There is no guarding or rebound.  Musculoskeletal:        General: No tenderness. Normal range of motion.     Cervical back: Normal range of motion and neck supple.  Lymphadenopathy:     Cervical: No cervical adenopathy.  Skin:    General: Skin is warm.     Coloration: Skin is not pale.     Findings: No erythema or rash.  Neurological:     Mental Status: He is alert and oriented to person, place, and time.     Cranial Nerves: No cranial nerve deficit.     Motor: No abnormal muscle tone.     Coordination: Coordination normal.     Deep Tendon Reflexes: Reflexes are normal and symmetric.  Psychiatric:        Behavior: Behavior normal.        Thought Content: Thought content normal.        Judgment: Judgment normal.    Appointment on 11/17/2021  Component Date Value Ref Range Status   PSA 11/17/2021 0.41  < OR = 4.00 ng/mL Final   Comment: The total PSA value from this assay system is  standardized against the WHO standard. The test  result will be approximately 20% lower when compared  to the equimolar-standardized total PSA (Beckman  Coulter). Comparison of serial PSA results should be  interpreted with this fact in mind. . This test was performed using the Siemens  chemiluminescent method. Values obtained from  different assay methods cannot be used interchangeably. PSA levels, regardless of value,  should not be interpreted as absolute evidence of the presence or absence of disease.  Cholesterol 11/17/2021 186  <200 mg/dL Final   HDL 11/17/2021 60  > OR = 40 mg/dL Final   Triglycerides 11/17/2021 88  <150 mg/dL Final   LDL Cholesterol (Calc) 11/17/2021 108 (H)  mg/dL (calc) Final   Comment: Reference range: <100 . Desirable range <100 mg/dL for primary prevention;   <70 mg/dL for patients with CHD or diabetic patients  with > or = 2 CHD risk factors. Marland Kitchen LDL-C is now calculated using the Martin-Hopkins  calculation, which is a validated novel method providing  better accuracy than the Friedewald equation in the  estimation of LDL-C.  Cresenciano Genre et al. Annamaria Helling. 1610;960(45): 2061-2068  (http://education.[facility].com/faq/FAQ164)    Total CHOL/HDL Ratio 11/17/2021 3.1  <5.0 (calc) Final   Non-HDL Cholesterol (Calc) 11/17/2021 126  <130 mg/dL (calc) Final   Comment: For patients with diabetes plus 1 major ASCVD risk  factor, treating to a non-HDL-C goal of <100 mg/dL  (LDL-C of <70 mg/dL) is considered a therapeutic  option.    WBC 11/17/2021 5.3  3.8 - 10.8 Thousand/uL Final   RBC 11/17/2021 5.24  4.20 - 5.80 Million/uL Final   Hemoglobin 11/17/2021 15.4  13.2 - 17.1 g/dL Final   HCT 11/17/2021 45.7  38.5 - 50.0 % Final   MCV 11/17/2021 87.2  80.0 - 100.0 fL Final   MCH 11/17/2021 29.4  27.0 - 33.0 pg Final   MCHC 11/17/2021 33.7  32.0 - 36.0 g/dL Final   RDW 11/17/2021 12.8  11.0 - 15.0 % Final   Platelets 11/17/2021 214  140 - 400 Thousand/uL Final   MPV 11/17/2021 9.7  7.5 - 12.5 fL Final   Neutro Abs 11/17/2021 2,878  1,500 - 7,800 cells/uL Final   Lymphs Abs 11/17/2021 1,776  850 - 3,900 cells/uL Final   Absolute Monocytes 11/17/2021 493  200 - 950 cells/uL Final   Eosinophils Absolute 11/17/2021 111  15 - 500 cells/uL Final   Basophils Absolute 11/17/2021 42  0 - 200 cells/uL Final   Neutrophils Relative % 11/17/2021 54.3  % Final   Total Lymphocyte 11/17/2021  33.5  % Final   Monocytes Relative 11/17/2021 9.3  % Final   Eosinophils Relative 11/17/2021 2.1  % Final   Basophils Relative 11/17/2021 0.8  % Final   Glucose, Bld 11/17/2021 99  65 - 99 mg/dL Final   Comment: .            Fasting reference interval .    BUN 11/17/2021 18  7 - 25 mg/dL Final   Creat 11/17/2021 1.18  0.70 - 1.35 mg/dL Final   BUN/Creatinine Ratio 40/98/1191 NOT APPLICABLE  6 - 22 (calc) Final   Sodium 11/17/2021 140  135 - 146 mmol/L Final   Potassium 11/17/2021 4.6  3.5 - 5.3 mmol/L Final   Chloride 11/17/2021 103  98 - 110 mmol/L Final   CO2 11/17/2021 33 (H)  20 - 32 mmol/L Final   Calcium 11/17/2021 9.4  8.6 - 10.3 mg/dL Final   Total Protein 11/17/2021 6.4  6.1 - 8.1 g/dL Final   Albumin 11/17/2021 4.3  3.6 - 5.1 g/dL Final   Globulin 11/17/2021 2.1  1.9 - 3.7 g/dL (calc) Final   AG Ratio 11/17/2021 2.0  1.0 - 2.5 (calc) Final   Total Bilirubin 11/17/2021 0.6  0.2 - 1.2 mg/dL Final   Alkaline phosphatase (APISO) 11/17/2021 47  35 - 144 U/L Final   AST 11/17/2021 13  10 - 35 U/L Final   ALT 11/17/2021 20  9 - 46 U/L Final   Testosterone 11/17/2021 714  250 - 827 ng/dL Final   Albumin 11/17/2021 4.3  3.6 - 5.1 g/dL Final   Sex Hormone Binding 11/17/2021 52  22 - 77 nmol/L Final   Testosterone, Free 11/17/2021 68.9  46.0 - 224.0 pg/mL Final   Testosterone, Bioavailable 11/17/2021 135.7  110.0 - 575.0 ng/dL Final         Assessment & Plan:  Routine general medical examination at a health care facility  Encounter for lipid screening for cardiovascular disease  Prostate cancer screening  Stage 3 chronic kidney disease, unspecified whether stage 3a or 3b CKD ([place])  Subclinical hypothyroidism Patient's physical exam is excellent.  I recommended a COVID booster which he politely declined.  PSA and colonoscopy are up-to-date.  Blood pressure is high here however he is anxious.  His blood pressures at home are normal.  Cholesterol is acceptable.  Continue his  current healthy living with daily exercise and eating a low-fat diet rich in fruits and vegetables.

## 2021-12-02 ENCOUNTER — Other Ambulatory Visit (HOSPITAL_COMMUNITY): Payer: Self-pay

## 2021-12-05 ENCOUNTER — Other Ambulatory Visit (HOSPITAL_COMMUNITY): Payer: Self-pay

## 2021-12-07 ENCOUNTER — Other Ambulatory Visit (HOSPITAL_COMMUNITY): Payer: Self-pay

## 2021-12-09 ENCOUNTER — Other Ambulatory Visit (HOSPITAL_COMMUNITY): Payer: Self-pay

## 2021-12-10 IMAGING — DX DG HIP (WITH OR WITHOUT PELVIS) 2-3V*R*
3 series · 3 of 3 positions shown · non-contrast
Comparison: None.

CLINICAL DATA: Bilateral hip pain. Question radiculopathy. No
trauma history submitted.

EXAM:
DG HIP (WITH OR WITHOUT PELVIS) 2-3V RIGHT

[pelvis ap]
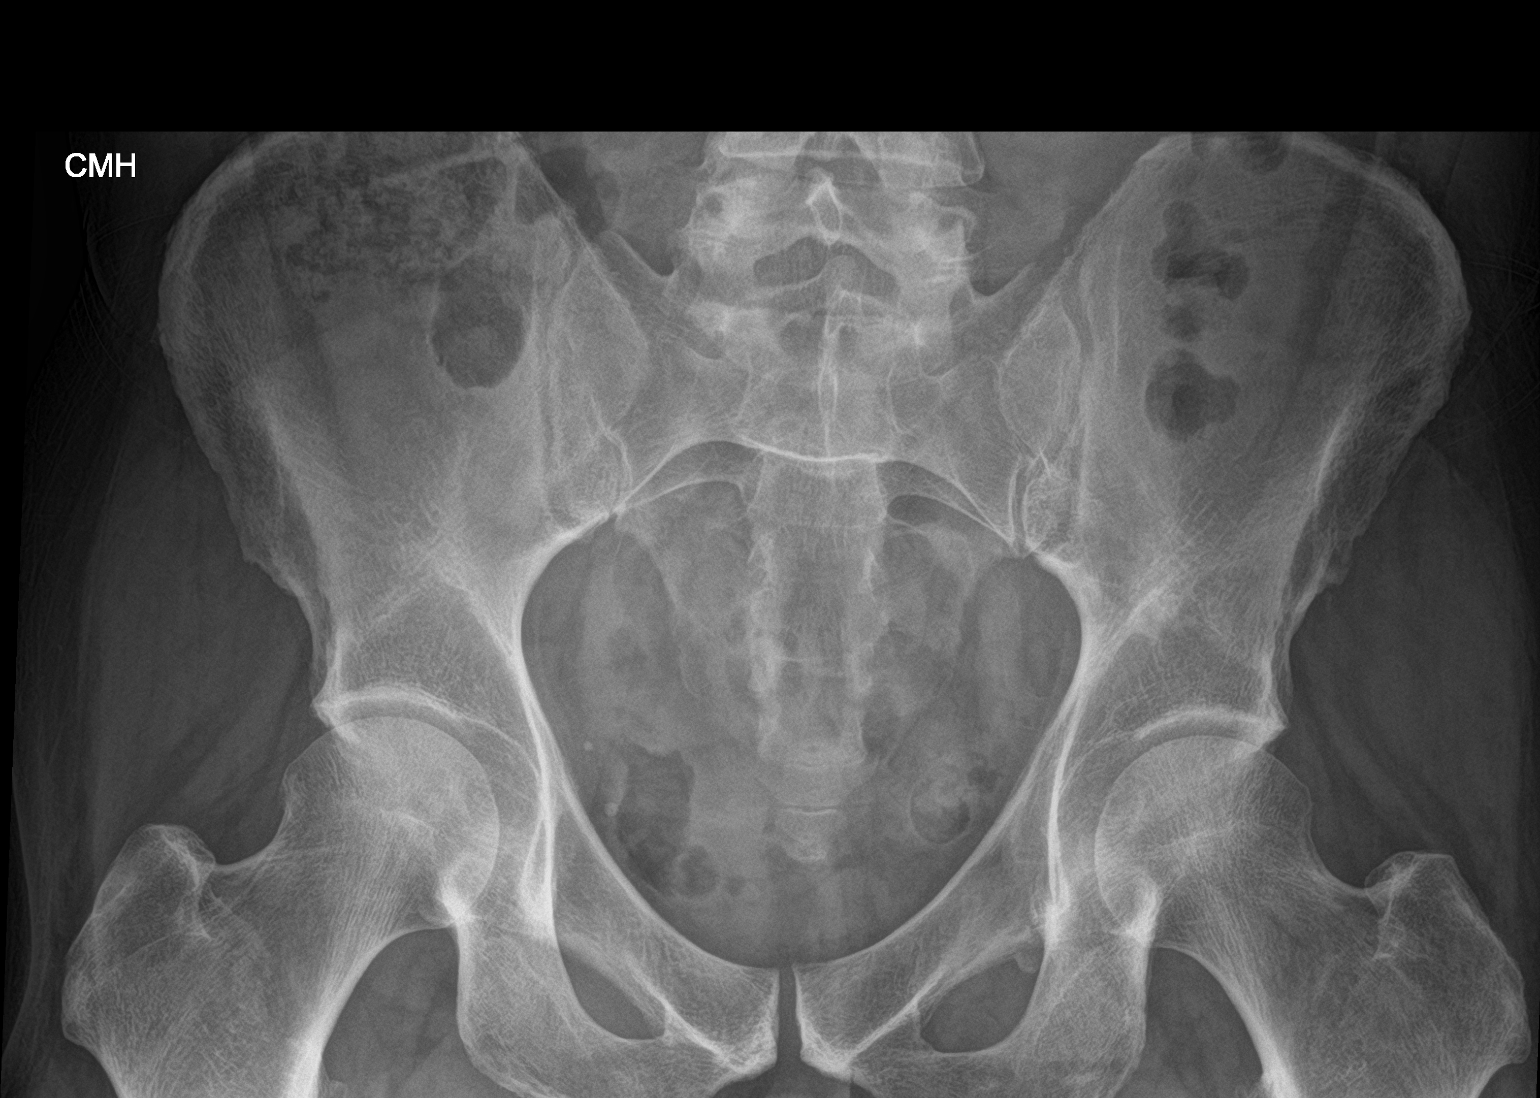

[hip ap]
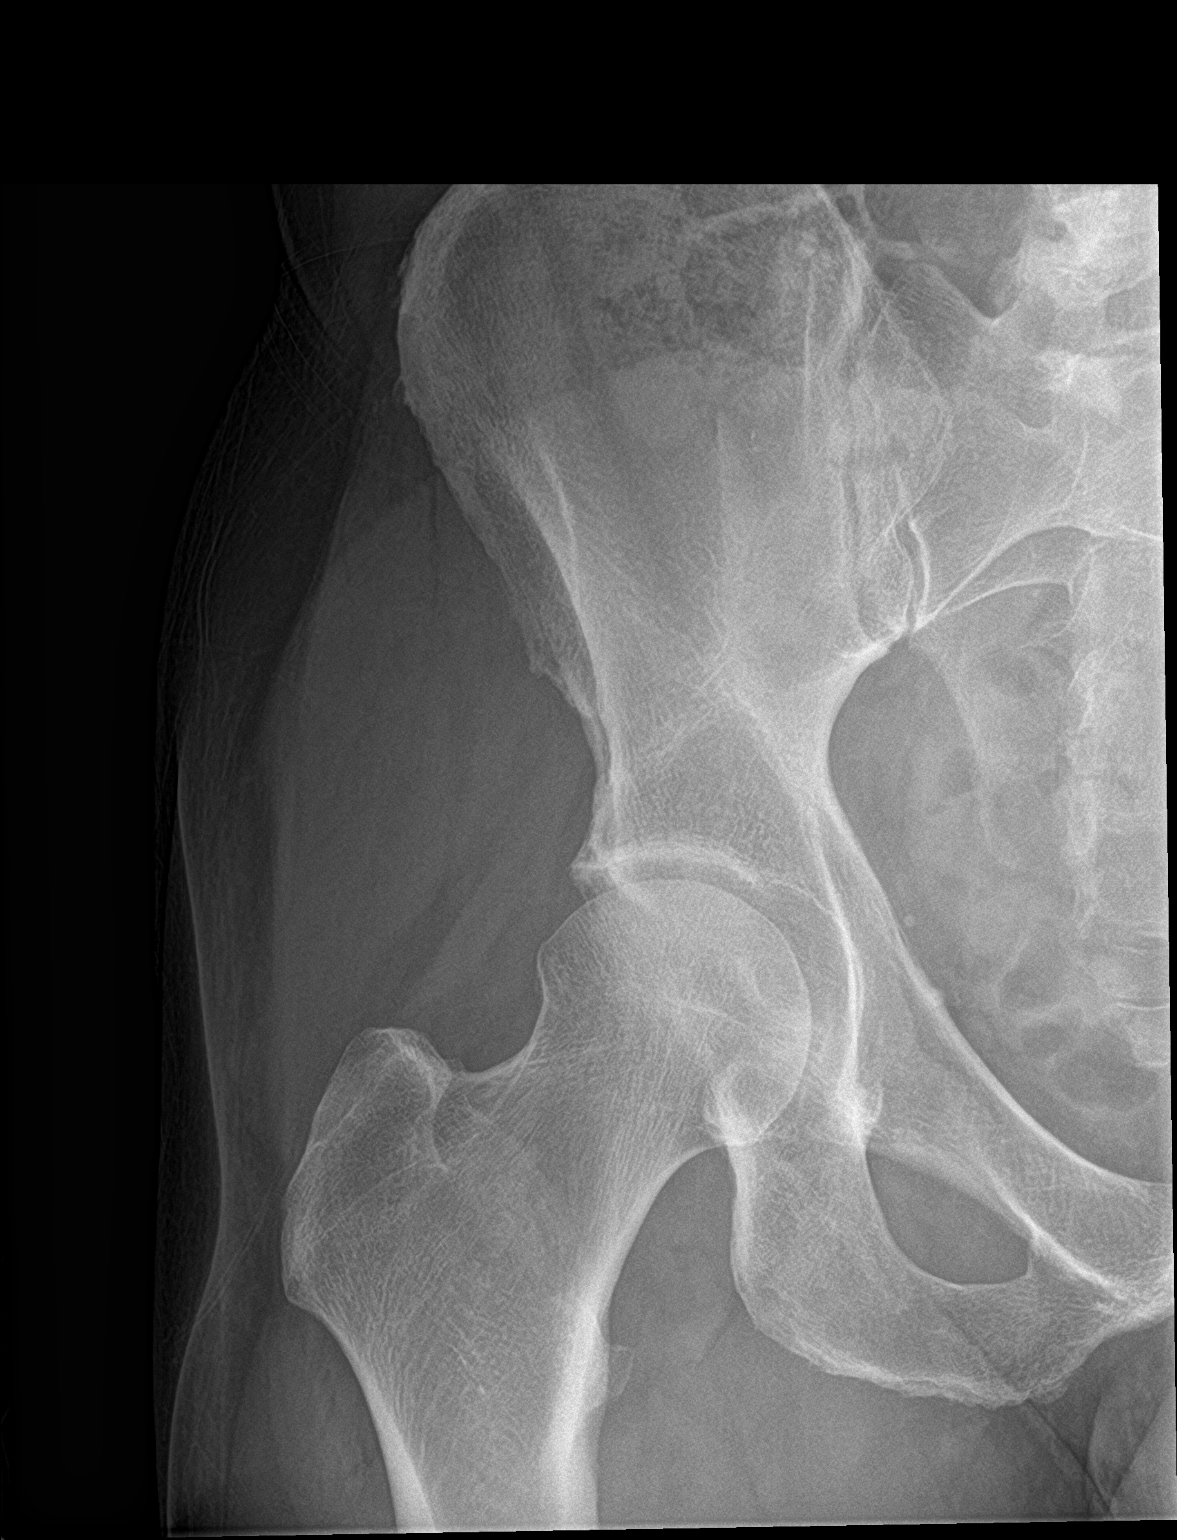

[hip frog leg]
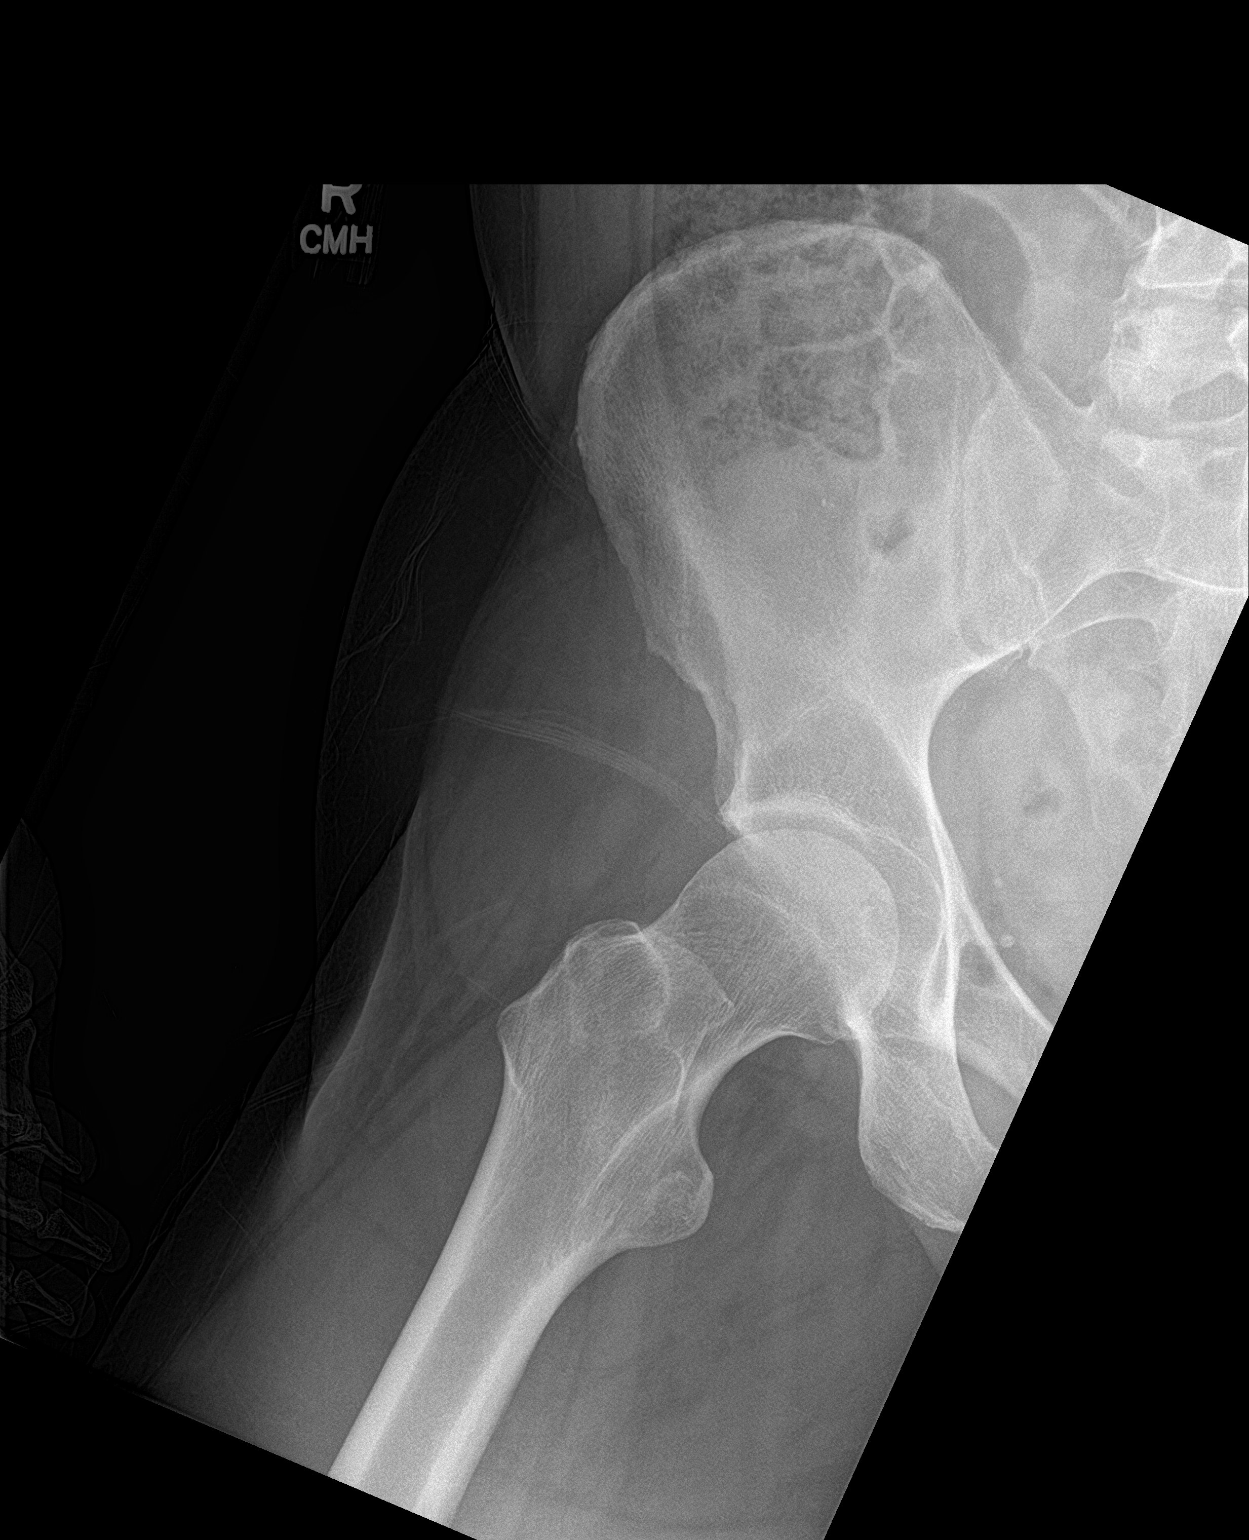

[3 of 3 positions shown; findings below may reference images not displayed]

FINDINGS: Femoral heads are located. No acute fracture. Joint spaces
maintained.
IMPRESSION: No acute osseous abnormality.

## 2021-12-12 ENCOUNTER — Other Ambulatory Visit (HOSPITAL_COMMUNITY): Payer: Self-pay

## 2021-12-12 MED ORDER — ACYCLOVIR 400 MG PO TABS
400.0000 mg | ORAL_TABLET | Freq: Every day | ORAL | 1 refills | Status: AC
  Filled 2021-12-12: qty 50, 10d supply, fill #0
  Filled 2022-06-27: qty 50, 10d supply, fill #1

## 2021-12-31 IMAGING — MR MR LUMBAR SPINE W/O CM
4 of 5 series · 27 of 48 positions shown · non-contrast
Comparison: No prior MRI, correlation is made with lumbar spine
radiographs 05/31/2021

CLINICAL DATA: Low back pain and hip pain

EXAM:
MRI LUMBAR SPINE WITHOUT CONTRAST
TECHNIQUE: Multiplanar, multisequence MR imaging of the lumbar spine was
performed. No intravenous contrast was administered.

[Series 2: T2 · sagittal · 4.0mm · 0.53mm/px · 6 of 15 slices shown (1 of 2)]
[im 1/15]
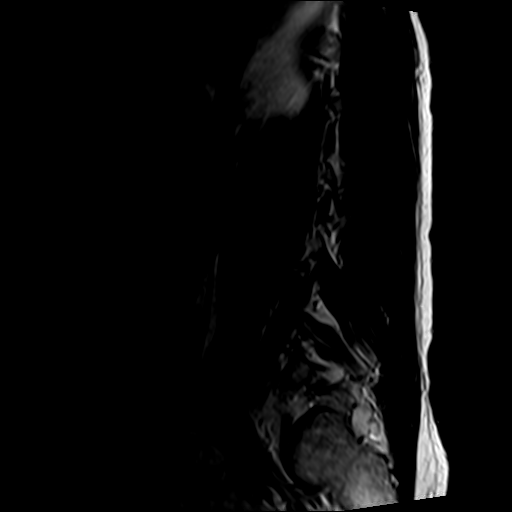
[im 3/15]
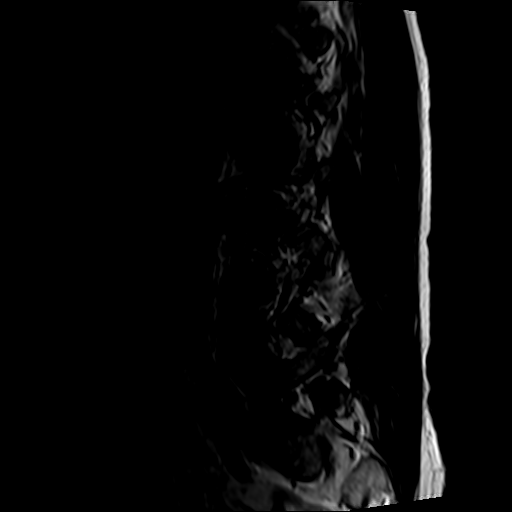
[im 6/15]
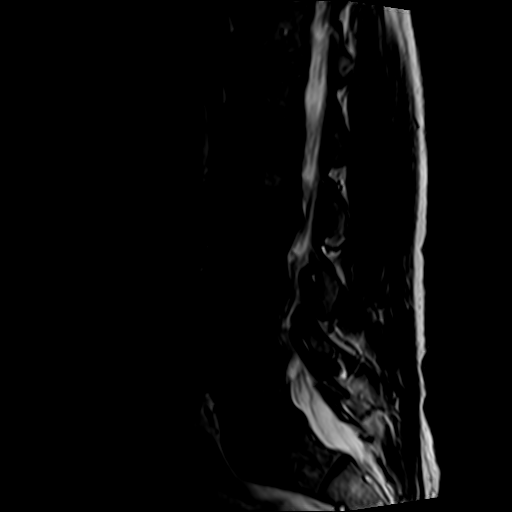
[im 9/15]
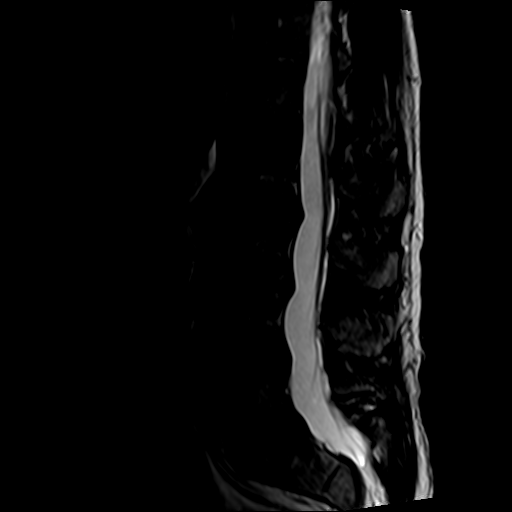
[im 12/15]
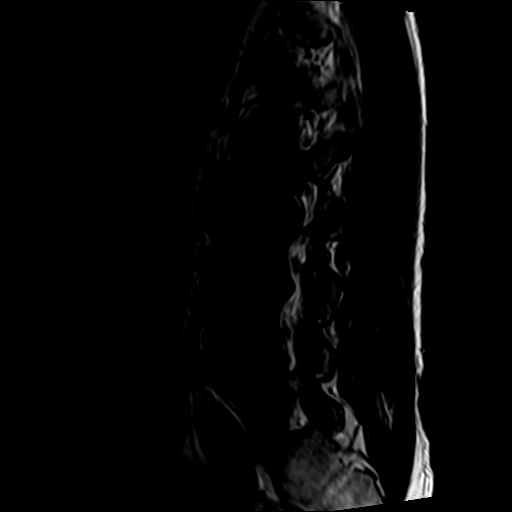
[im 15/15]
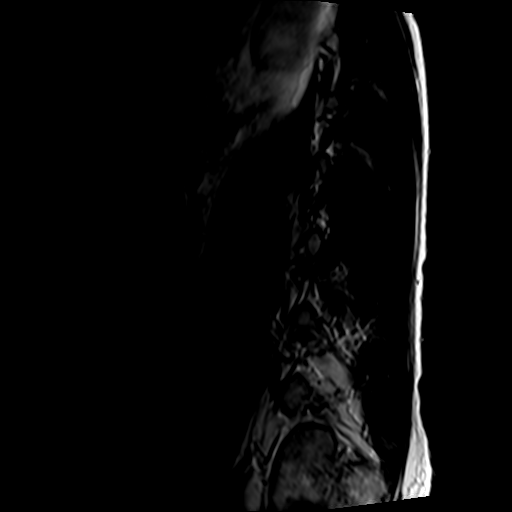

[Series 4: T1 · sagittal · 4.0mm · 0.53mm/px · 5 of 15 slices shown (1 of 2)]
[im 1/15]
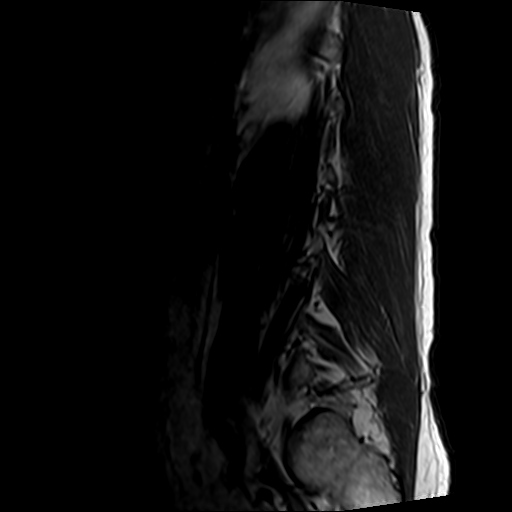
[im 4/15]
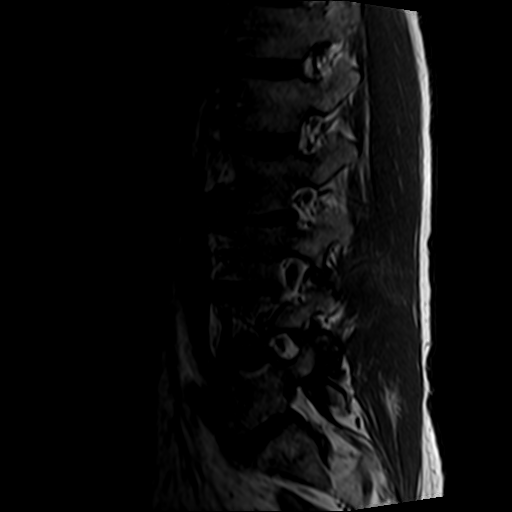
[im 8/15]
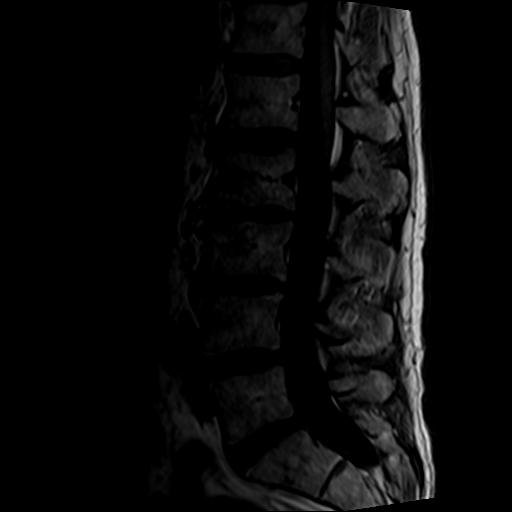
[im 11/15]
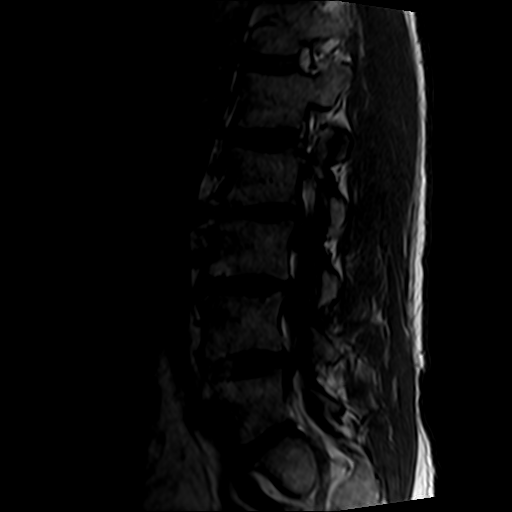
[im 15/15]
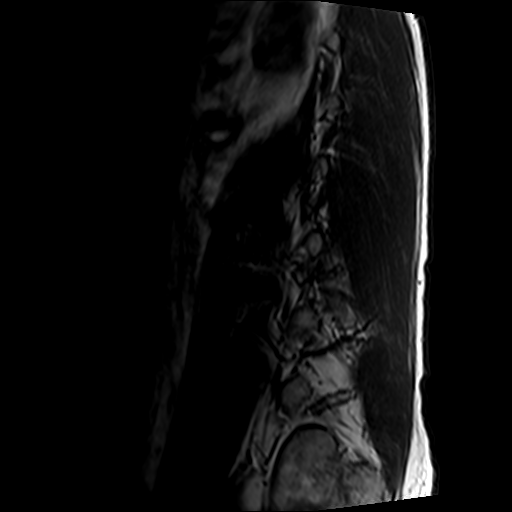

[Series 5: T2 · axial · 4.0mm · 0.70mm/px · z∈[-121,+116]mm · 10 of 44 slices shown (2 of 2)]
[im 3/44]
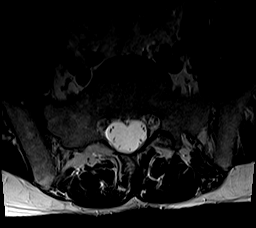
[im 6/44]
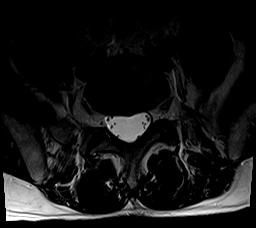
[im 9/44]
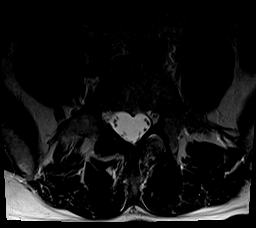
[im 15/44]
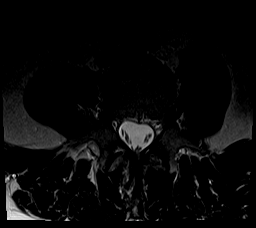
[im 21/44]
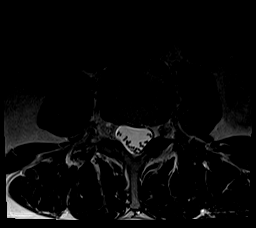
[im 23/44]
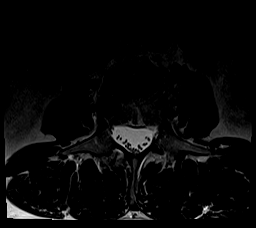
[im 26/44]
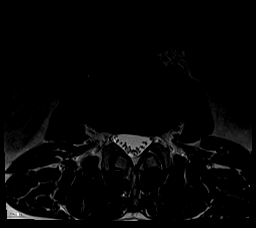
[im 32/44]
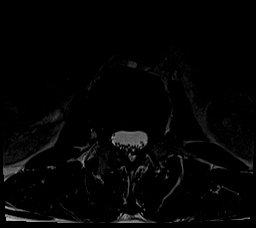
[im 38/44]
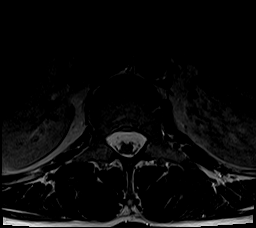
[im 44/44]
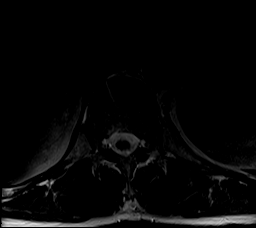

[Series 6: T1 · axial · 4.0mm · 0.35mm/px · z∈[-121,+85]mm · 6 of 44 slices shown (2 of 2)]
[im 3/44]
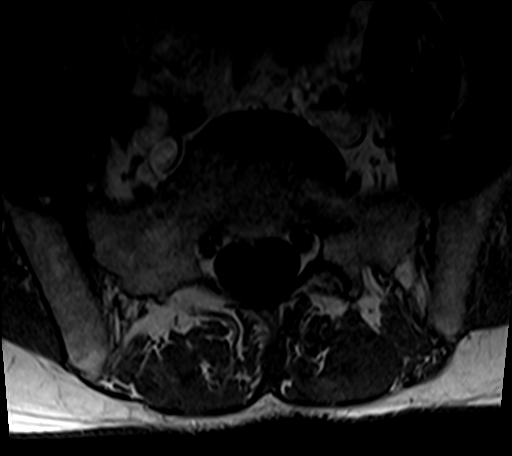
[im 6/44]
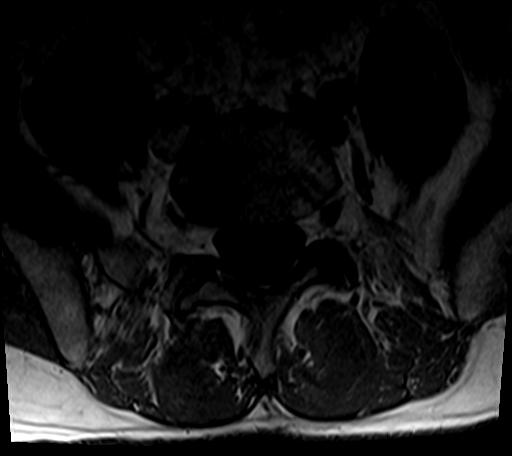
[im 9/44]
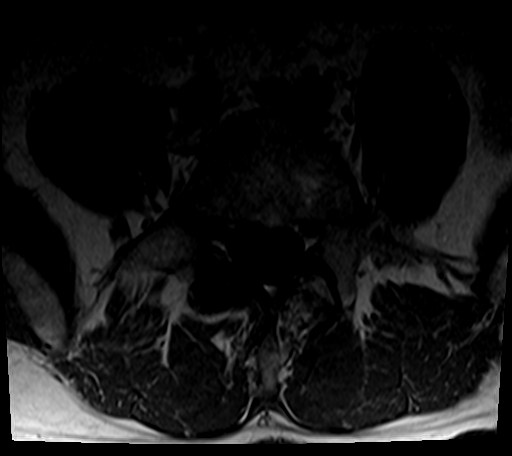
[im 15/44]
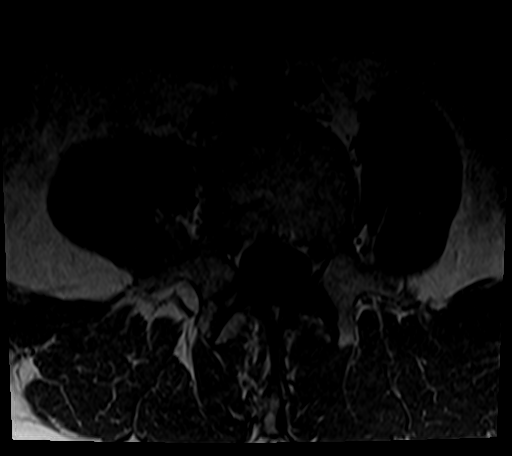
[im 23/44]
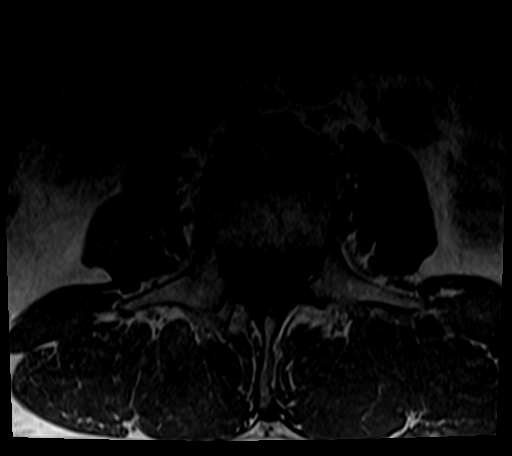
[im 38/44]
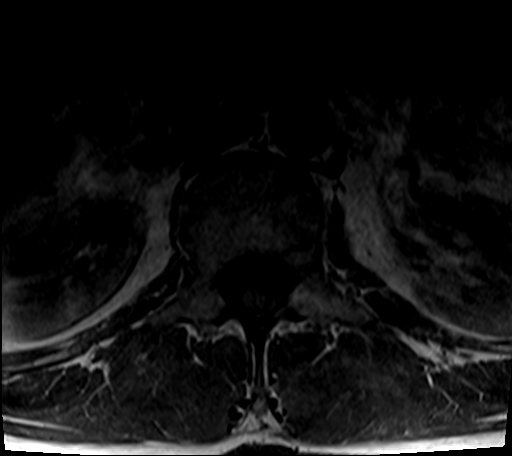

[27 of 48 positions shown; findings below may reference images not displayed]

FINDINGS: Segmentation:  Standard.

Alignment: Minimal levocurvature of the lumbar spine. Mild
straightening of the normal lumbar lordosis.

Vertebrae: No fracture, evidence of discitis, or bone lesion.
Endplate degenerative changes with Schmorl's node at L2-L3

Conus medullaris and cauda equina: Conus extends to the L1-L2 level.
Conus and cauda equina appear normal.

Paraspinal and other soft tissues: Negative.

Disc levels:

T12-L1: No significant disc bulge. No spinal canal stenosis or
neural foraminal narrowing.

L1-L2: No significant disc bulge. No spinal canal stenosis or neural
foraminal narrowing.

L2-L3: Disc height loss with broad-based disc bulge. Mild facet
arthropathy. No spinal canal stenosis or neural foraminal narrowing.

L3-L4: Disc height loss with broad-based disc bulge.
Mild-to-moderate facet arthropathy. No spinal canal stenosis. No
neural foraminal narrowing.

L4-L5: Mild disc bulge. Mild right-greater-than-left facet
arthropathy. No spinal canal stenosis. No neural foraminal
narrowing.

L5-S1: No significant disc bulge. Mild right greater than left facet
arthropathy. No spinal canal stenosis or neural foraminal narrowing.
IMPRESSION: Mild degenerative changes in the lumbar spine, without spinal canal
stenosis or significant neural foraminal narrowing.

## 2022-02-01 ENCOUNTER — Other Ambulatory Visit: Payer: Self-pay

## 2022-02-09 ENCOUNTER — Other Ambulatory Visit (HOSPITAL_COMMUNITY): Payer: Self-pay

## 2022-02-09 DIAGNOSIS — Z85828 Personal history of other malignant neoplasm of skin: Secondary | ICD-10-CM | POA: Diagnosis not present

## 2022-02-09 DIAGNOSIS — X32XXXA Exposure to sunlight, initial encounter: Secondary | ICD-10-CM | POA: Diagnosis not present

## 2022-02-09 DIAGNOSIS — Z08 Encounter for follow-up examination after completed treatment for malignant neoplasm: Secondary | ICD-10-CM | POA: Diagnosis not present

## 2022-02-09 DIAGNOSIS — L570 Actinic keratosis: Secondary | ICD-10-CM | POA: Diagnosis not present

## 2022-02-09 DIAGNOSIS — B001 Herpesviral vesicular dermatitis: Secondary | ICD-10-CM | POA: Diagnosis not present

## 2022-02-09 MED ORDER — ACYCLOVIR 400 MG PO TABS
400.0000 mg | ORAL_TABLET | Freq: Every day | ORAL | 6 refills | Status: DC
  Filled 2022-02-09: qty 90, 45d supply, fill #0

## 2022-03-22 ENCOUNTER — Other Ambulatory Visit: Payer: Self-pay | Admitting: Family Medicine

## 2022-03-22 ENCOUNTER — Other Ambulatory Visit (HOSPITAL_COMMUNITY): Payer: Self-pay

## 2022-03-23 ENCOUNTER — Other Ambulatory Visit (HOSPITAL_COMMUNITY): Payer: Self-pay

## 2022-03-23 MED ORDER — LOSARTAN POTASSIUM 25 MG PO TABS
25.0000 mg | ORAL_TABLET | Freq: Every day | ORAL | 0 refills | Status: DC
  Filled 2022-03-23: qty 90, 90d supply, fill #0

## 2022-03-23 NOTE — Telephone Encounter (Signed)
Last seen 3 months ago , no future appt at this time.  Requested Prescriptions  Pending Prescriptions Disp Refills  . losartan (COZAAR) 25 MG tablet 90 tablet 0    Sig: Take 1 tablet (25 mg total) by mouth daily.     Cardiovascular:  Angiotensin Receptor Blockers Failed - 03/22/2022  8:06 AM      Failed - Last BP in normal range    BP Readings from Last 1 Encounters:  11/29/21 (!) 144/82         Failed - Valid encounter within last 6 months    Recent Outpatient Visits          3 months ago Routine general medical examination at a health care facility   [place], Cammie Mcgee, MD   9 months ago Hip pain, bilateral   [place] Dennard Schaumann, Cammie Mcgee, MD   1 year ago Routine general medical examination at a health care facility   [place], Warren T, MD   2 years ago Chronic kidney disease (CKD), stage III (moderate) ([place])   [place] Susy Frizzle, MD   2 years ago Subclinical hypothyroidism   [place] Pickard, Cammie Mcgee, MD             Passed - Cr in normal range and within 180 days    Creat  Date Value Ref Range Status  11/17/2021 1.18 0.70 - 1.35 mg/dL Final         Passed - K in normal range and within 180 days    Potassium  Date Value Ref Range Status  11/17/2021 4.6 3.5 - 5.3 mmol/L Final         Passed - Patient is not pregnant

## 2022-03-30 ENCOUNTER — Other Ambulatory Visit: Payer: Self-pay | Admitting: Family Medicine

## 2022-03-30 NOTE — Telephone Encounter (Signed)
Requested Prescriptions  Pending Prescriptions Disp Refills  . traZODone (DESYREL) 50 MG tablet [Pharmacy Med Name: TRAZODONE '50MG'$  TABLETS] 30 tablet 0    Sig: TAKE 1/2 TO 1 TABLET(25 TO 50 MG) BY MOUTH AT BEDTIME AS NEEDED FOR SLEEP     Psychiatry: Antidepressants - Serotonin Modulator Failed - 03/30/2022  3:22 AM      Failed - Valid encounter within last 6 months    Recent Outpatient Visits          4 months ago Routine general medical examination at a health care facility   [place], Cammie Mcgee, MD   10 months ago Hip pain, bilateral   [place] Dennard Schaumann, Cammie Mcgee, MD   1 year ago Routine general medical examination at a health care facility   [place], Warren T, MD   2 years ago Chronic kidney disease (CKD), stage III (moderate) ([place])   [place] Susy Frizzle, MD   2 years ago Subclinical hypothyroidism   [employer] Pickard, Cammie Mcgee, MD

## 2022-06-19 ENCOUNTER — Other Ambulatory Visit: Payer: Self-pay | Admitting: Family Medicine

## 2022-06-19 ENCOUNTER — Other Ambulatory Visit (HOSPITAL_COMMUNITY): Payer: Self-pay

## 2022-06-19 MED ORDER — LOSARTAN POTASSIUM 25 MG PO TABS
25.0000 mg | ORAL_TABLET | Freq: Every day | ORAL | 3 refills | Status: DC
  Filled 2022-06-19: qty 90, 90d supply, fill #0
  Filled 2022-10-18: qty 90, 90d supply, fill #1
  Filled 2023-01-22: qty 90, 90d supply, fill #2
  Filled 2023-01-23: qty 90, 90d supply, fill #0
  Filled 2023-05-07: qty 90, 90d supply, fill #1

## 2022-06-27 ENCOUNTER — Other Ambulatory Visit (HOSPITAL_COMMUNITY): Payer: Self-pay

## 2022-07-26 ENCOUNTER — Ambulatory Visit (INDEPENDENT_AMBULATORY_CARE_PROVIDER_SITE_OTHER): Payer: 59

## 2022-07-26 DIAGNOSIS — E538 Deficiency of other specified B group vitamins: Secondary | ICD-10-CM

## 2022-07-26 DIAGNOSIS — Z23 Encounter for immunization: Secondary | ICD-10-CM

## 2022-07-26 MED ORDER — CYANOCOBALAMIN 1000 MCG/ML IJ SOLN
1000.0000 ug | Freq: Once | INTRAMUSCULAR | Status: AC
  Administered 2022-07-26: 1000 ug via INTRAMUSCULAR

## 2022-07-26 NOTE — Progress Notes (Signed)
Give pt flu vaccine and B12 inj(provided per pt) inj. Pt tol well, left w/no c/o.

## 2022-08-15 ENCOUNTER — Other Ambulatory Visit (HOSPITAL_COMMUNITY): Payer: Self-pay

## 2022-08-15 DIAGNOSIS — N486 Induration penis plastica: Secondary | ICD-10-CM | POA: Diagnosis not present

## 2022-08-15 DIAGNOSIS — N5201 Erectile dysfunction due to arterial insufficiency: Secondary | ICD-10-CM | POA: Diagnosis not present

## 2022-08-15 MED ORDER — TADALAFIL 20 MG PO TABS
20.0000 mg | ORAL_TABLET | ORAL | 6 refills | Status: DC | PRN
  Filled 2022-08-15: qty 30, 30d supply, fill #0

## 2022-08-16 ENCOUNTER — Other Ambulatory Visit (HOSPITAL_COMMUNITY): Payer: Self-pay

## 2022-09-25 ENCOUNTER — Other Ambulatory Visit (HOSPITAL_COMMUNITY): Payer: Self-pay

## 2022-10-03 ENCOUNTER — Other Ambulatory Visit (HOSPITAL_COMMUNITY): Payer: Self-pay

## 2022-10-18 ENCOUNTER — Other Ambulatory Visit (HOSPITAL_COMMUNITY): Payer: Self-pay

## 2022-10-25 DIAGNOSIS — Z08 Encounter for follow-up examination after completed treatment for malignant neoplasm: Secondary | ICD-10-CM | POA: Diagnosis not present

## 2022-10-25 DIAGNOSIS — X32XXXA Exposure to sunlight, initial encounter: Secondary | ICD-10-CM | POA: Diagnosis not present

## 2022-10-25 DIAGNOSIS — L814 Other melanin hyperpigmentation: Secondary | ICD-10-CM | POA: Diagnosis not present

## 2022-10-25 DIAGNOSIS — L821 Other seborrheic keratosis: Secondary | ICD-10-CM | POA: Diagnosis not present

## 2022-10-25 DIAGNOSIS — L570 Actinic keratosis: Secondary | ICD-10-CM | POA: Diagnosis not present

## 2022-10-25 DIAGNOSIS — D1801 Hemangioma of skin and subcutaneous tissue: Secondary | ICD-10-CM | POA: Diagnosis not present

## 2022-11-16 ENCOUNTER — Other Ambulatory Visit: Payer: 59

## 2022-11-17 ENCOUNTER — Other Ambulatory Visit: Payer: 59

## 2022-11-17 DIAGNOSIS — Z13220 Encounter for screening for lipoid disorders: Secondary | ICD-10-CM

## 2022-11-17 DIAGNOSIS — Z136 Encounter for screening for cardiovascular disorders: Secondary | ICD-10-CM | POA: Diagnosis not present

## 2022-11-17 DIAGNOSIS — N183 Chronic kidney disease, stage 3 unspecified: Secondary | ICD-10-CM

## 2022-11-17 DIAGNOSIS — Z125 Encounter for screening for malignant neoplasm of prostate: Secondary | ICD-10-CM

## 2022-11-17 DIAGNOSIS — E038 Other specified hypothyroidism: Secondary | ICD-10-CM

## 2022-11-17 DIAGNOSIS — R5382 Chronic fatigue, unspecified: Secondary | ICD-10-CM

## 2022-11-17 DIAGNOSIS — E538 Deficiency of other specified B group vitamins: Secondary | ICD-10-CM | POA: Diagnosis not present

## 2022-11-17 DIAGNOSIS — R030 Elevated blood-pressure reading, without diagnosis of hypertension: Secondary | ICD-10-CM

## 2022-11-18 LAB — LIPID PANEL
Cholesterol: 176 mg/dL (ref <200)
HDL: 61 mg/dL (ref >=40)
LDL Cholesterol (Calc): 100 mg/dL (calc) — ABNORMAL HIGH
Non-HDL Cholesterol (Calc): 115 mg/dL (calc) (ref <130)
Total CHOL/HDL Ratio: 2.9 (calc) (ref <5.0)
Triglycerides: 67 mg/dL (ref <150)

## 2022-11-18 LAB — CBC WITH DIFFERENTIAL/PLATELET
Absolute Monocytes: 365 cells/uL (ref 200–950)
Basophils Absolute: 29 cells/uL (ref 0–200)
Basophils Relative: 0.7 %
Eosinophils Absolute: 101 cells/uL (ref 15–500)
Eosinophils Relative: 2.4 %
HCT: 44.6 % (ref 38.5–50.0)
Hemoglobin: 15.4 g/dL (ref 13.2–17.1)
Lymphs Abs: 1495 cells/uL (ref 850–3900)
MCH: 29.8 pg (ref 27.0–33.0)
MCHC: 34.5 g/dL (ref 32.0–36.0)
MCV: 86.3 fL (ref 80.0–100.0)
MPV: 10.2 fL (ref 7.5–12.5)
Monocytes Relative: 8.7 %
Neutro Abs: 2209 cells/uL (ref 1500–7800)
Neutrophils Relative %: 52.6 %
Platelets: 205 10*3/uL (ref 140–400)
RBC: 5.17 10*6/uL (ref 4.20–5.80)
RDW: 12.2 % (ref 11.0–15.0)
Total Lymphocyte: 35.6 %
WBC: 4.2 10*3/uL (ref 3.8–10.8)

## 2022-11-18 LAB — COMPLETE METABOLIC PANEL WITH GFR
AG Ratio: 1.8 (calc) (ref 1.0–2.5)
ALT: 32 U/L (ref 9–46)
AST: 25 U/L (ref 10–35)
Albumin: 4.2 g/dL (ref 3.6–5.1)
Alkaline phosphatase (APISO): 60 U/L (ref 35–144)
BUN: 18 mg/dL (ref 7–25)
CO2: 27 mmol/L (ref 20–32)
Calcium: 8.8 mg/dL (ref 8.6–10.3)
Chloride: 104 mmol/L (ref 98–110)
Creat: 1.33 mg/dL (ref 0.70–1.35)
Globulin: 2.4 g/dL (calc) (ref 1.9–3.7)
Glucose, Bld: 96 mg/dL (ref 65–99)
Potassium: 4.5 mmol/L (ref 3.5–5.3)
Sodium: 139 mmol/L (ref 135–146)
Total Bilirubin: 0.6 mg/dL (ref 0.2–1.2)
Total Protein: 6.6 g/dL (ref 6.1–8.1)
eGFR: 61 mL/min/{1.73_m2} (ref >=60)

## 2022-11-18 LAB — VITAMIN B12: Vitamin B-12: 312 pg/mL (ref 200–1100)

## 2022-11-18 LAB — PSA: PSA: 0.41 ng/mL (ref <=4.00)

## 2022-11-18 LAB — TESTOSTERONE: Testosterone: 824 ng/dL (ref 250–827)

## 2022-11-29 ENCOUNTER — Other Ambulatory Visit: Payer: 59

## 2022-12-05 ENCOUNTER — Encounter: Payer: Self-pay | Admitting: Family Medicine

## 2022-12-05 ENCOUNTER — Ambulatory Visit (INDEPENDENT_AMBULATORY_CARE_PROVIDER_SITE_OTHER): Payer: No Typology Code available for payment source | Admitting: Family Medicine

## 2022-12-05 VITALS — BP 120/56 | HR 56 | Temp 97.7°F | Ht 74.0 in | Wt 202.8 lb

## 2022-12-05 DIAGNOSIS — Z0001 Encounter for general adult medical examination with abnormal findings: Secondary | ICD-10-CM | POA: Diagnosis not present

## 2022-12-05 DIAGNOSIS — Z125 Encounter for screening for malignant neoplasm of prostate: Secondary | ICD-10-CM

## 2022-12-05 DIAGNOSIS — N182 Chronic kidney disease, stage 2 (mild): Secondary | ICD-10-CM

## 2022-12-05 DIAGNOSIS — Z0000 Encounter for general adult medical examination without abnormal findings: Secondary | ICD-10-CM

## 2022-12-05 DIAGNOSIS — Z23 Encounter for immunization: Secondary | ICD-10-CM | POA: Diagnosis not present

## 2022-12-05 NOTE — Addendum Note (Signed)
Addended by: Randal Buba K on: 12/05/2022 10:23 AM   Modules accepted: Orders

## 2022-12-05 NOTE — Progress Notes (Signed)
Subjective:    Patient ID: Alex Caldwell, male    DOB: 12-Aug-1961, 62 y.o.   MRN: [id number]  HPI Patient is here today for complete physical exam.  Overall he is doing well.  His last colonoscopy was in 2019 was good for 10 years.  He recently had a PSA that was 0.41 and outstanding.  He is due for a Tdap as he is about to have a new grandchild.  He is also due for a COVID booster.  We also discussed the positive aspect of the RSV shot particular for a new grandchild.  Otherwise he is doing well.  His GFR was 61.  Creatinine is gradually increasing. 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 Then 5 a day if needed for treatment. 90 tablet 6   cyanocobalamin (VITAMIN B12) 1000 MCG/ML injection Inject 1 mL (1,000 mcg total) into the muscle every 30 (thirty) days. 12 mL 0   losartan (COZAAR) 25 MG tablet Take 1 tablet (25 mg total) by mouth daily. 90 tablet 3   tadalafil (CIALIS) 20 MG tablet Take 1 tablet (20 mg total) by mouth daily as needed 1 to 2 hours before planned activity 30 tablet 6   No current facility-administered medications on file prior to visit.   No Known Allergies Social History   Socioeconomic History   Marital status: Married    Spouse name: Not on file   Number of children: Not on file   Years of education: Not on file   Highest education level: Not on  file  Occupational History   Not on file  Tobacco Use   Smoking status: Never   Smokeless tobacco: Never  Vaping Use   Vaping Use: Never used  Substance and Sexual Activity   Alcohol use: Yes    Alcohol/week: 10.0 standard drinks of alcohol    Types: 10 Standard drinks or equivalent per week   Drug use: No   Sexual activity: Yes  Other Topics Concern   Not on file  Social History Narrative   Not on file   Social Determinants of Health   Financial Resource Strain: Not on file  Food Insecurity: Not on file  Transportation Needs: Not on file  Physical Activity: Not on file  Stress: Not on file  Social Connections: Not on file  Intimate Partner Violence: Not on file   Family History  Problem Relation Age of Onset   Cancer Mother        ovarian   Hyperlipidemia Father    Hypertension Father    Colon cancer Neg Hx    Esophageal cancer Neg Hx    Stomach cancer Neg Hx       Review of Systems  All other systems reviewed and are negative.  Objective:   Physical Exam Vitals reviewed.  Constitutional:      General: He is not in acute distress.    Appearance: He is well-developed. He is not diaphoretic.  HENT:     Head: Normocephalic and atraumatic.     Right Ear: External ear normal.     Left Ear: External ear normal.     Nose: Nose normal.     Mouth/Throat:     Pharynx: No oropharyngeal exudate.  Eyes:     General: No scleral icterus.       Right eye: No discharge.        Left eye: No discharge.     Conjunctiva/sclera: Conjunctivae normal.     Pupils: Pupils are equal, round, and reactive to light.  Neck:     Thyroid: No thyromegaly.     Vascular: No JVD.     Trachea: No tracheal deviation.  Cardiovascular:     Rate and Rhythm: Normal rate and regular rhythm.     Heart sounds: Normal heart sounds. No murmur heard.    No friction rub. No gallop.  Pulmonary:     Effort: Pulmonary effort is normal. No respiratory distress.     Breath sounds: Normal  breath sounds. No stridor. No wheezing or rales.  Chest:     Chest wall: No tenderness.  Abdominal:     General: Bowel sounds are normal. There is no distension.     Palpations: Abdomen is soft. There is no mass.     Tenderness: There is no abdominal tenderness. There is no guarding or rebound.  Musculoskeletal:        General: No tenderness. Normal range of motion.     Cervical back: Normal range of motion and neck supple.  Lymphadenopathy:     Cervical: No cervical adenopathy.  Skin:    General: Skin is warm.     Coloration: Skin is not pale.     Findings: No erythema or rash.  Neurological:     Mental Status: He is alert and oriented to person, place, and time.     Cranial Nerves: No cranial nerve deficit.     Motor: No abnormal muscle tone.     Coordination: Coordination normal.     Deep Tendon Reflexes: Reflexes are normal and symmetric.  Psychiatric:        Behavior: Behavior normal.        Thought Content: Thought content normal.        Judgment: Judgment normal.     Lab on 11/17/2022  Component Date Value Ref Range Status   WBC 11/17/2022 4.2  3.8 - 10.8 Thousand/uL Final   RBC 11/17/2022 5.17  4.20 - 5.80 Million/uL Final   Hemoglobin 11/17/2022 15.4  13.2 - 17.1 g/dL Final   HCT 11/17/2022 44.6  38.5 - 50.0 % Final   MCV 11/17/2022 86.3  80.0 - 100.0 fL Final   MCH 11/17/2022 29.8  27.0 - 33.0 pg Final   MCHC 11/17/2022 34.5  32.0 - 36.0 g/dL Final   RDW 11/17/2022 12.2  11.0 - 15.0 % Final   Platelets 11/17/2022 205  140 - 400 Thousand/uL Final   MPV 11/17/2022 10.2  7.5 - 12.5 fL Final   Neutro Abs 11/17/2022 2,209  1,500 - 7,800 cells/uL Final   Lymphs Abs 11/17/2022 1,495  850 - 3,900 cells/uL Final   Absolute Monocytes 11/17/2022 365  200 - 950 cells/uL Final   Eosinophils Absolute 11/17/2022 101  15 - 500 cells/uL Final  Basophils Absolute 11/17/2022 29  0 - 200 cells/uL Final   Neutrophils Relative % 11/17/2022 52.6  % Final   Total Lymphocyte  11/17/2022 35.6  % Final   Monocytes Relative 11/17/2022 8.7  % Final   Eosinophils Relative 11/17/2022 2.4  % Final   Basophils Relative 11/17/2022 0.7  % Final   Glucose, Bld 11/17/2022 96  65 - 99 mg/dL Final   Comment: .            Fasting reference interval .    BUN 11/17/2022 18  7 - 25 mg/dL Final   Creat 11/17/2022 1.33  0.70 - 1.35 mg/dL Final   eGFR 11/17/2022 61  > OR = 60 mL/min/1.29m Final   BUN/Creatinine Ratio 11/17/2022 SEE NOTE:  6 - 22 (calc) Final   Comment:    Not Reported: BUN and Creatinine are within    reference range. .    Sodium 11/17/2022 139  135 - 146 mmol/L Final   Potassium 11/17/2022 4.5  3.5 - 5.3 mmol/L Final   Chloride 11/17/2022 104  98 - 110 mmol/L Final   CO2 11/17/2022 27  20 - 32 mmol/L Final   Calcium 11/17/2022 8.8  8.6 - 10.3 mg/dL Final   Total Protein 11/17/2022 6.6  6.1 - 8.1 g/dL Final   Albumin 11/17/2022 4.2  3.6 - 5.1 g/dL Final   Globulin 11/17/2022 2.4  1.9 - 3.7 g/dL (calc) Final   AG Ratio 11/17/2022 1.8  1.0 - 2.5 (calc) Final   Total Bilirubin 11/17/2022 0.6  0.2 - 1.2 mg/dL Final   Alkaline phosphatase (APISO) 11/17/2022 60  35 - 144 U/L Final   AST 11/17/2022 25  10 - 35 U/L Final   ALT 11/17/2022 32  9 - 46 U/L Final   Cholesterol 11/17/2022 176  <200 mg/dL Final   HDL 11/17/2022 61  > OR = 40 mg/dL Final   Triglycerides 11/17/2022 67  <150 mg/dL Final   LDL Cholesterol (Calc) 11/17/2022 100 (H)  mg/dL (calc) Final   Comment: Reference range: <100 . Desirable range <100 mg/dL for primary prevention;   <70 mg/dL for patients with CHD or diabetic patients  with > or = 2 CHD risk factors. .Marland KitchenLDL-C is now calculated using the Martin-Hopkins  calculation, which is a validated novel method providing  better accuracy than the Friedewald equation in the  estimation of LDL-C.  MCresenciano Genreet al. JAnnamaria Helling [id number];144(31: [id number]  (http://education.[employer].com/faq/FAQ164)    Total CHOL/HDL Ratio 11/17/2022 2.9  <5.0  (calc) Final   Non-HDL Cholesterol (Calc) 11/17/2022 115  <130 mg/dL (calc) Final   Comment: For patients with diabetes plus 1 major ASCVD risk  factor, treating to a non-HDL-C goal of <100 mg/dL  (LDL-C of <70 mg/dL) is considered a therapeutic  option.    PSA 11/17/2022 0.41  < OR = 4.00 ng/mL Final   Comment: The total PSA value from this assay system is  standardized against the WHO standard. The test  result will be approximately 20% lower when compared  to the equimolar-standardized total PSA (Beckman  Coulter). Comparison of serial PSA results should be  interpreted with this fact in mind. . This test was performed using the Siemens  chemiluminescent method. Values obtained from  different assay methods cannot be used interchangeably. PSA levels, regardless of value, should not be interpreted as absolute evidence of the presence or absence of disease.    Testosterone 11/17/2022 824  250 - 827 ng/dL Final   Vitamin  B-12 11/17/2022 312  200 - 1,100 pg/mL Final   Comment: . Please Note: Although the reference range for vitamin B12 is [phone] pg/mL, it has been reported that between 5 and 10% of patients with values between 200 and 400 pg/mL may experience neuropsychiatric and hematologic abnormalities due to occult B12 deficiency; less than 1% of patients with values above 400 pg/mL will have symptoms. .          Assessment & Plan:  CKD (chronic kidney disease) stage 2, GFR 60-89 ml/min - Plan: Protein / Creatinine Ratio, Urine  Routine general medical examination at a health care facility  Prostate cancer screening Blood pressure today is outstanding.  I will check a urine protein to creatinine ratio and if elevated consider [place].  And a COVID booster.  Recommended a Tdap which she received here.  Recommended RSV vaccine next fall when his grandchild is born.  Cholesterol is outstanding.  Colonoscopy is up-to-date.  PSA is excellent

## 2022-12-06 LAB — PROTEIN / CREATININE RATIO, URINE
Creatinine, Urine: 57 mg/dL (ref 20–320)
Protein/Creat Ratio: 70 mg/g creat (ref 25–148)
Protein/Creatinine Ratio: 0.07 mg/mg creat (ref 0.025–0.148)
Total Protein, Urine: 4 mg/dL — ABNORMAL LOW (ref 5–25)

## 2022-12-07 ENCOUNTER — Encounter: Payer: Self-pay | Admitting: Family Medicine

## 2022-12-20 ENCOUNTER — Other Ambulatory Visit (HOSPITAL_COMMUNITY): Payer: Self-pay

## 2022-12-20 ENCOUNTER — Other Ambulatory Visit: Payer: Self-pay | Admitting: Family Medicine

## 2022-12-20 MED ORDER — CYANOCOBALAMIN 1000 MCG/ML IJ SOLN
1000.0000 ug | INTRAMUSCULAR | 0 refills | Status: DC
  Filled 2022-12-20: qty 3, 90d supply, fill #0
  Filled 2023-03-22: qty 3, 90d supply, fill #1
  Filled 2023-06-25: qty 3, 90d supply, fill #2
  Filled 2023-11-01: qty 3, 90d supply, fill #3

## 2022-12-25 ENCOUNTER — Other Ambulatory Visit (HOSPITAL_COMMUNITY): Payer: Self-pay

## 2022-12-25 DIAGNOSIS — X32XXXA Exposure to sunlight, initial encounter: Secondary | ICD-10-CM | POA: Diagnosis not present

## 2022-12-25 DIAGNOSIS — Z85828 Personal history of other malignant neoplasm of skin: Secondary | ICD-10-CM | POA: Diagnosis not present

## 2022-12-25 DIAGNOSIS — L570 Actinic keratosis: Secondary | ICD-10-CM | POA: Diagnosis not present

## 2022-12-25 DIAGNOSIS — Z08 Encounter for follow-up examination after completed treatment for malignant neoplasm: Secondary | ICD-10-CM | POA: Diagnosis not present

## 2022-12-25 DIAGNOSIS — B001 Herpesviral vesicular dermatitis: Secondary | ICD-10-CM | POA: Diagnosis not present

## 2022-12-25 MED ORDER — ACYCLOVIR 400 MG PO TABS
400.0000 mg | ORAL_TABLET | Freq: Every day | ORAL | 6 refills | Status: AC
  Filled 2022-12-25: qty 90, 45d supply, fill #0
  Filled 2023-02-05: qty 90, 45d supply, fill #1
  Filled 2023-03-22: qty 90, 45d supply, fill #2
  Filled 2023-06-25: qty 90, 45d supply, fill #3
  Filled 2023-08-06: qty 90, 45d supply, fill #4
  Filled 2023-11-01: qty 90, 45d supply, fill #5

## 2023-01-22 ENCOUNTER — Other Ambulatory Visit (HOSPITAL_COMMUNITY): Payer: Self-pay

## 2023-01-23 ENCOUNTER — Other Ambulatory Visit (HOSPITAL_COMMUNITY): Payer: Self-pay

## 2023-01-23 ENCOUNTER — Other Ambulatory Visit: Payer: Self-pay

## 2023-02-05 ENCOUNTER — Other Ambulatory Visit (HOSPITAL_COMMUNITY): Payer: Self-pay

## 2023-02-16 DIAGNOSIS — M79605 Pain in left leg: Secondary | ICD-10-CM | POA: Diagnosis not present

## 2023-02-16 DIAGNOSIS — S86112A Strain of other muscle(s) and tendon(s) of posterior muscle group at lower leg level, left leg, initial encounter: Secondary | ICD-10-CM | POA: Diagnosis not present

## 2023-03-22 ENCOUNTER — Other Ambulatory Visit (HOSPITAL_COMMUNITY): Payer: Self-pay

## 2023-03-23 DIAGNOSIS — S86112D Strain of other muscle(s) and tendon(s) of posterior muscle group at lower leg level, left leg, subsequent encounter: Secondary | ICD-10-CM | POA: Diagnosis not present

## 2023-04-14 DIAGNOSIS — S61213A Laceration without foreign body of left middle finger without damage to nail, initial encounter: Secondary | ICD-10-CM | POA: Diagnosis not present

## 2023-04-14 DIAGNOSIS — Y9389 Activity, other specified: Secondary | ICD-10-CM | POA: Diagnosis not present

## 2023-04-27 DIAGNOSIS — S86112D Strain of other muscle(s) and tendon(s) of posterior muscle group at lower leg level, left leg, subsequent encounter: Secondary | ICD-10-CM | POA: Diagnosis not present

## 2023-04-30 DIAGNOSIS — H04123 Dry eye syndrome of bilateral lacrimal glands: Secondary | ICD-10-CM | POA: Diagnosis not present

## 2023-05-07 ENCOUNTER — Other Ambulatory Visit (HOSPITAL_COMMUNITY): Payer: Self-pay

## 2023-06-13 DIAGNOSIS — M25652 Stiffness of left hip, not elsewhere classified: Secondary | ICD-10-CM | POA: Diagnosis not present

## 2023-06-13 DIAGNOSIS — R262 Difficulty in walking, not elsewhere classified: Secondary | ICD-10-CM | POA: Diagnosis not present

## 2023-06-13 DIAGNOSIS — M5451 Vertebrogenic low back pain: Secondary | ICD-10-CM | POA: Diagnosis not present

## 2023-06-18 DIAGNOSIS — M5451 Vertebrogenic low back pain: Secondary | ICD-10-CM | POA: Diagnosis not present

## 2023-06-18 DIAGNOSIS — M25652 Stiffness of left hip, not elsewhere classified: Secondary | ICD-10-CM | POA: Diagnosis not present

## 2023-06-18 DIAGNOSIS — R262 Difficulty in walking, not elsewhere classified: Secondary | ICD-10-CM | POA: Diagnosis not present

## 2023-06-20 DIAGNOSIS — R262 Difficulty in walking, not elsewhere classified: Secondary | ICD-10-CM | POA: Diagnosis not present

## 2023-06-20 DIAGNOSIS — M25652 Stiffness of left hip, not elsewhere classified: Secondary | ICD-10-CM | POA: Diagnosis not present

## 2023-06-20 DIAGNOSIS — M5451 Vertebrogenic low back pain: Secondary | ICD-10-CM | POA: Diagnosis not present

## 2023-06-22 DIAGNOSIS — S86111A Strain of other muscle(s) and tendon(s) of posterior muscle group at lower leg level, right leg, initial encounter: Secondary | ICD-10-CM | POA: Diagnosis not present

## 2023-06-25 ENCOUNTER — Other Ambulatory Visit (HOSPITAL_COMMUNITY): Payer: Self-pay

## 2023-06-27 DIAGNOSIS — M5451 Vertebrogenic low back pain: Secondary | ICD-10-CM | POA: Diagnosis not present

## 2023-06-27 DIAGNOSIS — X32XXXA Exposure to sunlight, initial encounter: Secondary | ICD-10-CM | POA: Diagnosis not present

## 2023-06-27 DIAGNOSIS — L821 Other seborrheic keratosis: Secondary | ICD-10-CM | POA: Diagnosis not present

## 2023-06-27 DIAGNOSIS — M25652 Stiffness of left hip, not elsewhere classified: Secondary | ICD-10-CM | POA: Diagnosis not present

## 2023-06-27 DIAGNOSIS — R262 Difficulty in walking, not elsewhere classified: Secondary | ICD-10-CM | POA: Diagnosis not present

## 2023-06-27 DIAGNOSIS — L570 Actinic keratosis: Secondary | ICD-10-CM | POA: Diagnosis not present

## 2023-07-03 ENCOUNTER — Ambulatory Visit: Payer: No Typology Code available for payment source | Admitting: Sports Medicine

## 2023-07-03 VITALS — BP 134/66 | Ht 74.0 in | Wt 200.0 lb

## 2023-07-03 DIAGNOSIS — S86811A Strain of other muscle(s) and tendon(s) at lower leg level, right leg, initial encounter: Secondary | ICD-10-CM

## 2023-07-03 NOTE — Progress Notes (Signed)
   Subjective:    Patient ID: Alex Caldwell, male    DOB: 05/22/1961, 62 y.o.   MRN: [id number]  HPI chief complaint: Right calf pain  Alex Caldwell presents with 10 days of right calf pain.  He suffered an injury while playing pickle ball.  He felt a pop in the right calf that felt like someone hit him with a baseball bat.  He lives in [place] and was seen at a local Ortho office in town.  He has had 2 previous left calf injuries, both while playing pickle ball.  He localizes his pain to the mid portion of his distal calf.  He did notice some swelling around the ankle shortly after his injury.  He did quite a bit of physical therapy with his previous left calf injury.  He has had some limited PT with his new injury.  Interval medical history reviewed Medications reviewed Allergies reviewed    Review of Systems As above    Objective:   Physical Exam  Well-developed, fit appearing.  No acute distress  Right calf: There is tenderness to palpation in the distal midportion of the right calf.  No palpable defect.  Slight swelling around the right ankle.  No ecchymosis.  Good plantarflexion with Alex Caldwell testing.  Limited MSK ultrasound of the right distal calf does show some hypoechoic changes in the distal fibers of the medial gastrocnemius.  No hematoma      Assessment & Plan:   Right calf strain  I think it is important for him to work with physical therapy acutely.  I think he will quickly transfer to a home exercise program since he has a good understanding of most of the exercises from his previous left calf injury.  I also think 5/16 inch heel lifts will be helpful.  I have also recommended that he use a compression sleeve on the right calf when active.  He may resume activity as tolerated but he may want to give up pickleball since he has had 3 separate Injuries within a very short period of time.  This note was dictated using Dragon naturally speaking software and may contain  errors in syntax, spelling, or content which have not been identified prior to signing this note.

## 2023-07-11 DIAGNOSIS — M25571 Pain in right ankle and joints of right foot: Secondary | ICD-10-CM | POA: Diagnosis not present

## 2023-07-19 DIAGNOSIS — M25571 Pain in right ankle and joints of right foot: Secondary | ICD-10-CM | POA: Diagnosis not present

## 2023-07-26 DIAGNOSIS — M25571 Pain in right ankle and joints of right foot: Secondary | ICD-10-CM | POA: Diagnosis not present

## 2023-08-01 DIAGNOSIS — M25571 Pain in right ankle and joints of right foot: Secondary | ICD-10-CM | POA: Diagnosis not present

## 2023-08-06 ENCOUNTER — Other Ambulatory Visit: Payer: Self-pay

## 2023-08-06 ENCOUNTER — Other Ambulatory Visit (HOSPITAL_COMMUNITY): Payer: Self-pay

## 2023-08-06 ENCOUNTER — Other Ambulatory Visit: Payer: Self-pay | Admitting: Family Medicine

## 2023-08-07 ENCOUNTER — Other Ambulatory Visit (HOSPITAL_COMMUNITY): Payer: Self-pay

## 2023-08-07 NOTE — Telephone Encounter (Signed)
Requested medication (s) are due for refill today: yes  Requested medication (s) are on the active medication list: yes  Last refill:  06/19/22 90 3 RF  Future visit scheduled: no  Notes to clinic:  overdue lab work    Requested Prescriptions  Pending Prescriptions Disp Refills   losartan (COZAAR) 25 MG tablet 90 tablet 3    Sig: Take 1 tablet (25 mg total) by mouth daily.     Cardiovascular:  Angiotensin Receptor Blockers Failed - 08/06/2023 10:01 AM      Failed - Cr in normal range and within 180 days    Creat  Date Value Ref Range Status  11/17/2022 1.33 0.70 - 1.35 mg/dL Final   Creatinine, Urine  Date Value Ref Range Status  12/05/2022 57 20 - 320 mg/dL Final         Failed - K in normal range and within 180 days    Potassium  Date Value Ref Range Status  11/17/2022 4.5 3.5 - 5.3 mmol/L Final         Failed - Valid encounter within last 6 months    Recent Outpatient Visits           1 year ago Routine general medical examination at a health care facility   [facility] Donita Brooks, MD   2 years ago Hip pain, bilateral   [facility] Tanya Nones, Priscille Heidelberg, MD   2 years ago Routine general medical examination at a health care facility   [facility] Donita Brooks, MD   4 years ago Chronic kidney disease (CKD), stage III (moderate) (HCC)   [facility] Donita Brooks, MD   4 years ago Subclinical hypothyroidism   [facility] Pickard, Priscille Heidelberg, MD              Passed - Patient is not pregnant      Passed - Last BP in normal range    BP Readings from Last 1 Encounters:  07/03/23 134/66

## 2023-08-10 DIAGNOSIS — M25571 Pain in right ankle and joints of right foot: Secondary | ICD-10-CM | POA: Diagnosis not present

## 2023-08-14 ENCOUNTER — Other Ambulatory Visit (HOSPITAL_COMMUNITY): Payer: Self-pay

## 2023-08-15 ENCOUNTER — Other Ambulatory Visit: Payer: Self-pay | Admitting: Family Medicine

## 2023-08-15 ENCOUNTER — Other Ambulatory Visit (HOSPITAL_COMMUNITY): Payer: Self-pay

## 2023-08-15 MED ORDER — LOSARTAN POTASSIUM 25 MG PO TABS
25.0000 mg | ORAL_TABLET | Freq: Every day | ORAL | 0 refills | Status: DC
  Filled 2023-08-15: qty 90, 90d supply, fill #0

## 2023-08-15 NOTE — Telephone Encounter (Signed)
Requested Prescriptions  Pending Prescriptions Disp Refills   losartan (COZAAR) 25 MG tablet 90 tablet 0    Sig: Take 1 tablet (25 mg total) by mouth daily.     Cardiovascular:  Angiotensin Receptor Blockers Failed - 08/15/2023  1:44 PM      Failed - Cr in normal range and within 180 days    Creat  Date Value Ref Range Status  11/17/2022 1.33 0.70 - 1.35 mg/dL Final   Creatinine, Urine  Date Value Ref Range Status  12/05/2022 57 20 - 320 mg/dL Final         Failed - K in normal range and within 180 days    Potassium  Date Value Ref Range Status  11/17/2022 4.5 3.5 - 5.3 mmol/L Final         Failed - Valid encounter within last 6 months    Recent Outpatient Visits           1 year ago Routine general medical examination at a health care facility   [facility] Donita Brooks, MD   2 years ago Hip pain, bilateral   [facility] Tanya Nones, Priscille Heidelberg, MD   2 years ago Routine general medical examination at a health care facility   [facility] Donita Brooks, MD   4 years ago Chronic kidney disease (CKD), stage III (moderate) (HCC)   [facility] Donita Brooks, MD   4 years ago Subclinical hypothyroidism   [facility] Pickard, Priscille Heidelberg, MD              Passed - Patient is not pregnant      Passed - Last BP in normal range    BP Readings from Last 1 Encounters:  07/03/23 134/66

## 2023-10-29 DIAGNOSIS — L814 Other melanin hyperpigmentation: Secondary | ICD-10-CM | POA: Diagnosis not present

## 2023-10-29 DIAGNOSIS — L853 Xerosis cutis: Secondary | ICD-10-CM | POA: Diagnosis not present

## 2023-10-29 DIAGNOSIS — D1801 Hemangioma of skin and subcutaneous tissue: Secondary | ICD-10-CM | POA: Diagnosis not present

## 2023-10-29 DIAGNOSIS — L821 Other seborrheic keratosis: Secondary | ICD-10-CM | POA: Diagnosis not present

## 2023-10-29 DIAGNOSIS — L570 Actinic keratosis: Secondary | ICD-10-CM | POA: Diagnosis not present

## 2023-10-29 DIAGNOSIS — X32XXXA Exposure to sunlight, initial encounter: Secondary | ICD-10-CM | POA: Diagnosis not present

## 2023-11-01 ENCOUNTER — Other Ambulatory Visit (HOSPITAL_COMMUNITY): Payer: Self-pay

## 2023-11-27 ENCOUNTER — Other Ambulatory Visit: Payer: No Typology Code available for payment source

## 2023-11-27 DIAGNOSIS — N182 Chronic kidney disease, stage 2 (mild): Secondary | ICD-10-CM | POA: Diagnosis not present

## 2023-11-27 DIAGNOSIS — E038 Other specified hypothyroidism: Secondary | ICD-10-CM | POA: Diagnosis not present

## 2023-11-27 DIAGNOSIS — Z136 Encounter for screening for cardiovascular disorders: Secondary | ICD-10-CM | POA: Diagnosis not present

## 2023-11-27 DIAGNOSIS — R5383 Other fatigue: Secondary | ICD-10-CM | POA: Diagnosis not present

## 2023-11-27 DIAGNOSIS — E538 Deficiency of other specified B group vitamins: Secondary | ICD-10-CM | POA: Diagnosis not present

## 2023-11-27 DIAGNOSIS — Z125 Encounter for screening for malignant neoplasm of prostate: Secondary | ICD-10-CM | POA: Diagnosis not present

## 2023-11-27 DIAGNOSIS — Z13220 Encounter for screening for lipoid disorders: Secondary | ICD-10-CM | POA: Diagnosis not present

## 2023-11-30 ENCOUNTER — Encounter: Payer: Self-pay | Admitting: Family Medicine

## 2023-11-30 DIAGNOSIS — N182 Chronic kidney disease, stage 2 (mild): Secondary | ICD-10-CM

## 2023-12-01 LAB — TEST AUTHORIZATION

## 2023-12-01 LAB — LIPID PANEL
Cholesterol: 214 mg/dL — ABNORMAL HIGH (ref <200)
HDL: 62 mg/dL (ref >=40)
LDL Cholesterol (Calc): 136 mg/dL — ABNORMAL HIGH
Non-HDL Cholesterol (Calc): 152 mg/dL — ABNORMAL HIGH (ref <130)
Total CHOL/HDL Ratio: 3.5 (calc) (ref <5.0)
Triglycerides: 65 mg/dL (ref <150)

## 2023-12-01 LAB — COMPLETE METABOLIC PANEL WITH GFR
AG Ratio: 1.9 (calc) (ref 1.0–2.5)
ALT: 24 U/L (ref 9–46)
AST: 23 U/L (ref 10–35)
Albumin: 4.5 g/dL (ref 3.6–5.1)
Alkaline phosphatase (APISO): 45 U/L (ref 35–144)
BUN/Creatinine Ratio: 13 (calc) (ref 6–22)
BUN: 21 mg/dL (ref 7–25)
CO2: 24 mmol/L (ref 20–32)
Calcium: 9.9 mg/dL (ref 8.6–10.3)
Chloride: 102 mmol/L (ref 98–110)
Creat: 1.58 mg/dL — ABNORMAL HIGH (ref 0.70–1.35)
Globulin: 2.4 g/dL (ref 1.9–3.7)
Glucose, Bld: 95 mg/dL (ref 65–99)
Potassium: 4.6 mmol/L (ref 3.5–5.3)
Sodium: 140 mmol/L (ref 135–146)
Total Bilirubin: 0.5 mg/dL (ref 0.2–1.2)
Total Protein: 6.9 g/dL (ref 6.1–8.1)
eGFR: 49 mL/min/{1.73_m2} — ABNORMAL LOW (ref >=60)

## 2023-12-01 LAB — PSA: PSA: 0.45 ng/mL (ref <=4.00)

## 2023-12-01 LAB — CBC WITH DIFFERENTIAL/PLATELET
Absolute Lymphocytes: 1232 {cells}/uL (ref 850–3900)
Absolute Monocytes: 400 {cells}/uL (ref 200–950)
Basophils Absolute: 21 {cells}/uL (ref 0–200)
Basophils Relative: 0.4 %
Eosinophils Absolute: 62 {cells}/uL (ref 15–500)
Eosinophils Relative: 1.2 %
HCT: 43.3 % (ref 38.5–50.0)
Hemoglobin: 14.5 g/dL (ref 13.2–17.1)
MCH: 30 pg (ref 27.0–33.0)
MCHC: 33.5 g/dL (ref 32.0–36.0)
MCV: 89.5 fL (ref 80.0–100.0)
MPV: 10.2 fL (ref 7.5–12.5)
Monocytes Relative: 7.7 %
Neutro Abs: 3484 {cells}/uL (ref 1500–7800)
Neutrophils Relative %: 67 %
Platelets: 202 10*3/uL (ref 140–400)
RBC: 4.84 10*6/uL (ref 4.20–5.80)
RDW: 13.1 % (ref 11.0–15.0)
Total Lymphocyte: 23.7 %
WBC: 5.2 10*3/uL (ref 3.8–10.8)

## 2023-12-01 LAB — TESTOSTERONE: Testosterone: 688 ng/dL (ref 250–827)

## 2023-12-01 LAB — VITAMIN B12: Vitamin B-12: 350 pg/mL (ref 200–1100)

## 2023-12-01 LAB — TSH: TSH: 3.29 m[IU]/L (ref 0.40–4.50)

## 2023-12-10 ENCOUNTER — Other Ambulatory Visit (HOSPITAL_COMMUNITY): Payer: Self-pay

## 2023-12-10 ENCOUNTER — Other Ambulatory Visit: Payer: Self-pay | Admitting: Family Medicine

## 2023-12-11 ENCOUNTER — Other Ambulatory Visit (HOSPITAL_COMMUNITY): Payer: Self-pay

## 2023-12-11 ENCOUNTER — Ambulatory Visit (INDEPENDENT_AMBULATORY_CARE_PROVIDER_SITE_OTHER): Payer: No Typology Code available for payment source | Admitting: Family Medicine

## 2023-12-11 ENCOUNTER — Encounter: Payer: Self-pay | Admitting: Family Medicine

## 2023-12-11 VITALS — BP 136/72 | HR 59 | Temp 97.9°F | Ht 74.0 in | Wt 201.8 lb

## 2023-12-11 DIAGNOSIS — E7800 Pure hypercholesterolemia, unspecified: Secondary | ICD-10-CM | POA: Diagnosis not present

## 2023-12-11 DIAGNOSIS — Z0000 Encounter for general adult medical examination without abnormal findings: Secondary | ICD-10-CM

## 2023-12-11 DIAGNOSIS — Z125 Encounter for screening for malignant neoplasm of prostate: Secondary | ICD-10-CM

## 2023-12-11 DIAGNOSIS — Z0001 Encounter for general adult medical examination with abnormal findings: Secondary | ICD-10-CM | POA: Diagnosis not present

## 2023-12-11 DIAGNOSIS — N1831 Chronic kidney disease, stage 3a: Secondary | ICD-10-CM

## 2023-12-11 NOTE — Telephone Encounter (Signed)
Requested medication (s) are due for refill today: yes   Requested medication (s) are on the active medication list: yes   Last refill:  08/15/23 #90 0 refills  Future visit scheduled: yes today   Notes to clinic:   overdue OV . Scheduled today . How many refills do you want to give?     Requested Prescriptions  Pending Prescriptions Disp Refills   losartan (COZAAR) 25 MG tablet 90 tablet 0    Sig: Take 1 tablet (25 mg total) by mouth daily.     Cardiovascular:  Angiotensin Receptor Blockers Failed - 12/11/2023  9:24 AM      Failed - Cr in normal range and within 180 days    Creat  Date Value Ref Range Status  11/27/2023 1.58 (H) 0.70 - 1.35 mg/dL Final   Creatinine, Urine  Date Value Ref Range Status  12/05/2022 57 20 - 320 mg/dL Final         Failed - Valid encounter within last 6 months    Recent Outpatient Visits           2 years ago Routine general medical examination at a health care facility   [facility] Pickard, Priscille Heidelberg, MD   2 years ago Hip pain, bilateral   [facility] Tanya Nones, Priscille Heidelberg, MD   3 years ago Routine general medical examination at a health care facility   [facility] Donita Brooks, MD   4 years ago Chronic kidney disease (CKD), stage III (moderate) (HCC)   [facility] Donita Brooks, MD   4 years ago Subclinical hypothyroidism   [facility] Tanya Nones, Priscille Heidelberg, MD              Passed - K in normal range and within 180 days    Potassium  Date Value Ref Range Status  11/27/2023 4.6 3.5 - 5.3 mmol/L Final         Passed - Patient is not pregnant      Passed - Last BP in normal range    BP Readings from Last 1 Encounters:  07/03/23 134/66

## 2023-12-11 NOTE — Progress Notes (Signed)
Subjective:    Patient ID: Alex Caldwell, male    DOB: Nov 30, 1960, 63 y.o.   MRN: [id number]  HPI Patient is here today for complete physical exam.  Overall he is doing well.  His last colonoscopy was in 2019 was good for 10 years.  We discussed the pneumonia vaccine but he declines that today.  He has had the shingles shot.  He has had his flu shot.  His PSA was recently checked and was normal.  However his creatinine has risen to 1.58.  His GFR has dropped to 49.  His baseline is in the 60s.  This is an acute change.  The patient denies any dysuria.  He denies any NSAID use.  He exercises on a daily basis.  He is taking creatine.  I recommend he discontinue this.  He denies any hematuria or dysuria or back pain. Lab on 11/27/2023  Component Date Value Ref Range Status   WBC 11/27/2023 5.2  3.8 - 10.8 Thousand/uL Final   RBC 11/27/2023 4.84  4.20 - 5.80 Million/uL Final   Hemoglobin 11/27/2023 14.5  13.2 - 17.1 g/dL Final   HCT 40/98/1191 43.3  38.5 - 50.0 % Final   MCV 11/27/2023 89.5  80.0 - 100.0 fL Final   MCH 11/27/2023 30.0  27.0 - 33.0 pg Final   MCHC 11/27/2023 33.5  32.0 - 36.0 g/dL Final   Comment: For adults, a slight decrease in the calculated MCHC value (in the range of 30 to 32 g/dL) is most likely not clinically significant; however, it should be interpreted with caution in correlation with other red cell parameters and the patient's clinical condition.    RDW 11/27/2023 13.1  11.0 - 15.0 % Final   Platelets 11/27/2023 202  140 - 400 Thousand/uL Final   MPV 11/27/2023 10.2  7.5 - 12.5 fL Final   Neutro Abs 11/27/2023 3,484  1,500 - 7,800 cells/uL Final   Absolute Lymphocytes 11/27/2023 1,232  850 - 3,900 cells/uL Final   Absolute Monocytes 11/27/2023 400  200 - 950 cells/uL Final   Eosinophils Absolute 11/27/2023 62  15 - 500 cells/uL Final   Basophils Absolute 11/27/2023 21  0 - 200 cells/uL Final   Neutrophils Relative % 11/27/2023 67  % Final   Total Lymphocyte  11/27/2023 23.7  % Final   Monocytes Relative 11/27/2023 7.7  % Final   Eosinophils Relative 11/27/2023 1.2  % Final   Basophils Relative 11/27/2023 0.4  % Final   Cholesterol 11/27/2023 214 (H)  <200 mg/dL Final   HDL 47/82/9562 62  > OR = 40 mg/dL Final   Triglycerides 13/05/6577 65  <150 mg/dL Final   LDL Cholesterol (Calc) 11/27/2023 136 (H)  mg/dL (calc) Final   Comment: Reference range: <100 . Desirable range <100 mg/dL for primary prevention;   <70 mg/dL for patients with CHD or diabetic patients  with > or = 2 CHD risk factors. Marland Kitchen LDL-C is now calculated using the Martin-Hopkins  calculation, which is a validated novel method providing  better accuracy than the Friedewald equation in the  estimation of LDL-C.  Horald Pollen et al. Lenox Ahr. 4696;295(28): 2061-2068  (http://education.[facility].com/faq/FAQ164)    Total CHOL/HDL Ratio 11/27/2023 3.5  <4.1 (calc) Final   Non-HDL Cholesterol (Calc) 11/27/2023 152 (H)  <130 mg/dL (calc) Final   Comment: For patients with diabetes plus 1 major ASCVD risk  factor, treating to a non-HDL-C goal of <100 mg/dL  (LDL-C of <32 mg/dL) is considered a therapeutic  option.    PSA 11/27/2023 0.45  < OR = 4.00 ng/mL Final   Comment: The total PSA value from this assay system is  standardized against the WHO standard. The test  result will be approximately 20% lower when compared  to the equimolar-standardized total PSA (Beckman  Coulter). Comparison of serial PSA results should be  interpreted with this fact in mind. . This test was performed using the Siemens  chemiluminescent method. Values obtained from  different assay methods cannot be used interchangeably. PSA levels, regardless of value, should not be interpreted as absolute evidence of the presence or absence of disease.    Testosterone 11/27/2023 688  250 - 827 ng/dL Final   Vitamin W-09 81/19/1478 350  200 - 1,100 pg/mL Final   Comment: . Please Note: Although the reference  range for vitamin B12 is [phone] pg/mL, it has been reported that between 5 and 10% of patients with values between 200 and 400 pg/mL may experience neuropsychiatric and hematologic abnormalities due to occult B12 deficiency; less than 1% of patients with values above 400 pg/mL will have symptoms. .    TSH 11/27/2023 3.29  0.40 - 4.50 mIU/L Final   Glucose, Bld 11/27/2023 95  65 - 99 mg/dL Final   Comment: .            Fasting reference interval .    BUN 11/27/2023 21  7 - 25 mg/dL Final   Creat 29/56/2130 1.58 (H)  0.70 - 1.35 mg/dL Final   eGFR 86/57/8469 49 (L)  > OR = 60 mL/min/1.104m2 Final   BUN/Creatinine Ratio 11/27/2023 13  6 - 22 (calc) Final   Sodium 11/27/2023 140  135 - 146 mmol/L Final   Potassium 11/27/2023 4.6  3.5 - 5.3 mmol/L Final   Chloride 11/27/2023 102  98 - 110 mmol/L Final   CO2 11/27/2023 24  20 - 32 mmol/L Final   Calcium 11/27/2023 9.9  8.6 - 10.3 mg/dL Final   Total Protein 62/95/2841 6.9  6.1 - 8.1 g/dL Final   Albumin 32/44/0102 4.5  3.6 - 5.1 g/dL Final   Globulin 72/53/6644 2.4  1.9 - 3.7 g/dL (calc) Final   AG Ratio 11/27/2023 1.9  1.0 - 2.5 (calc) Final   Total Bilirubin 11/27/2023 0.5  0.2 - 1.2 mg/dL Final   Alkaline phosphatase (APISO) 11/27/2023 45  35 - 144 U/L Final   AST 11/27/2023 23  10 - 35 U/L Final   ALT 11/27/2023 24  9 - 46 U/L Final   TEST NAME: 11/27/2023 COMPREHENSIVE METABOLIC   Final   TEST CODE: 03/47/4259 [id number]   Final   CLIENT CONTACT: 11/27/2023 Lowella Grip   Final   REPORT ALWAYS MESSAGE SIGNATURE 11/27/2023    Final   Comment: . The laboratory testing on this patient was verbally requested or confirmed by the ordering physician or his or her authorized representative after contact with an employee of [employer]. Federal regulations require that we maintain on file written authorization for all laboratory testing.  Accordingly we are asking that the ordering physician or his or her authorized  representative sign a copy of this report and promptly return it to the client service representative. . . Signature:____________________________________________________ . Please fax this signed page to [phone] or return it via your [employer] courier.     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 90 tablet 6   cyanocobalamin (VITAMIN B12) 1000 MCG/ML injection Inject 1 mL (1,000 mcg total) into the muscle every 30 (thirty) days. 12 mL 0   losartan (COZAAR) 25 MG tablet Take 1 tablet (25 mg total) by mouth daily. 90 tablet 0   No current facility-administered medications on file prior to visit.   No Known Allergies Social History   Socioeconomic History   Marital status: Married    Spouse name: Not on file   Number of children: Not on file   Years of education: Not on file   Highest education level: Not on file  Occupational History   Not on file  Tobacco Use   Smoking status: Never   Smokeless tobacco: Never  Vaping Use   Vaping status: Never Used  Substance and Sexual Activity   Alcohol use: Yes    Alcohol/week: 10.0 standard drinks of alcohol    Types: 10 Standard drinks or equivalent per week   Drug use: No   Sexual activity: Yes  Other Topics Concern   Not on file  Social  History Narrative   Not on file   Social Drivers of Health   Financial Resource Strain: Not on file  Food Insecurity: Not on file  Transportation Needs: Not on file  Physical Activity: Not on file  Stress: Not on file  Social Connections: Not on file  Intimate Partner Violence: Not on file   Family History  Problem Relation Age of Onset   Cancer Mother        ovarian   Hyperlipidemia Father    Hypertension Father    Colon cancer Neg Hx    Esophageal cancer Neg Hx    Stomach cancer Neg Hx       Review of Systems  All other systems reviewed and are negative.      Objective:   Physical Exam Vitals reviewed.  Constitutional:      General: He is not in acute distress.    Appearance: He is well-developed. He is not diaphoretic.  HENT:     Head: Normocephalic and atraumatic.     Right Ear: External ear normal.     Left Ear: External ear normal.     Nose: Nose normal.     Mouth/Throat:     Pharynx: No oropharyngeal exudate.  Eyes:     General: No scleral icterus.       Right eye: No discharge.        Left eye: No discharge.     Conjunctiva/sclera: Conjunctivae normal.     Pupils: Pupils are equal, round, and reactive to light.  Neck:     Thyroid: No thyromegaly.     Vascular: No JVD.     Trachea: No tracheal deviation.  Cardiovascular:     Rate and Rhythm: Normal rate and  regular rhythm.     Heart sounds: Normal heart sounds. No murmur heard.    No friction rub. No gallop.  Pulmonary:     Effort: Pulmonary effort is normal. No respiratory distress.     Breath sounds: Normal breath sounds. No stridor. No wheezing or rales.  Chest:     Chest wall: No tenderness.  Abdominal:     General: Bowel sounds are normal. There is no distension.     Palpations: Abdomen is soft. There is no mass.     Tenderness: There is no abdominal tenderness. There is no guarding or rebound.  Musculoskeletal:        General: No tenderness. Normal range of motion.     Cervical back:  Normal range of motion and neck supple.  Lymphadenopathy:     Cervical: No cervical adenopathy.  Skin:    General: Skin is warm.     Coloration: Skin is not pale.     Findings: No erythema or rash.  Neurological:     Mental Status: He is alert and oriented to person, place, and time.     Cranial Nerves: No cranial nerve deficit.     Motor: No abnormal muscle tone.     Coordination: Coordination normal.     Deep Tendon Reflexes: Reflexes are normal and symmetric.  Psychiatric:        Behavior: Behavior normal.        Thought Content: Thought content normal.        Judgment: Judgment normal.          Assessment & Plan:  Stage 3a chronic kidney disease (HCC) - Plan: US Renal, Protein / Creatinine Ratio, Urine  Pure hypercholesterolemia - Plan: CT CARDIAC SCORING (SELF PAY ONLY)  Routine general medical examination at a health care facility  Prostate cancer screening I am concerned about his elevated creatinine.  I recommended checking a urine protein to creatinine ratio and if elevated I would recommend adding [place].  I will also obtain ultrasound to evaluate for any obstruction or malignancy.  We discussed treating his cholesterol and he would like to proceed with a cardiac CT to calculate a coronary artery calcium score to determine his risk before taking a statin.  He declines the pneumonia vaccine today.  PSA and colonoscopy are up-to-date.

## 2023-12-12 LAB — PROTEIN / CREATININE RATIO, URINE
Creatinine, Urine: 100 mg/dL (ref 20–320)
Total Protein, Urine: <4 mg/dL — ABNORMAL LOW (ref 5–25)

## 2023-12-18 ENCOUNTER — Other Ambulatory Visit (HOSPITAL_COMMUNITY): Payer: Self-pay

## 2023-12-18 ENCOUNTER — Ambulatory Visit
Admission: RE | Admit: 2023-12-18 | Discharge: 2023-12-18 | Disposition: A | Discharge status: Home / Self Care | Payer: No Typology Code available for payment source | Source: Ambulatory Visit | Attending: Family Medicine | Admitting: Family Medicine

## 2023-12-18 ENCOUNTER — Other Ambulatory Visit: Payer: Self-pay | Admitting: Family Medicine

## 2023-12-18 DIAGNOSIS — E7800 Pure hypercholesterolemia, unspecified: Secondary | ICD-10-CM

## 2023-12-18 DIAGNOSIS — N1831 Chronic kidney disease, stage 3a: Secondary | ICD-10-CM

## 2023-12-18 DIAGNOSIS — N289 Disorder of kidney and ureter, unspecified: Secondary | ICD-10-CM | POA: Diagnosis not present

## 2023-12-18 MED ORDER — LOSARTAN POTASSIUM 25 MG PO TABS
25.0000 mg | ORAL_TABLET | Freq: Every day | ORAL | 3 refills | Status: DC
  Filled 2023-12-18: qty 90, 90d supply, fill #0

## 2023-12-18 NOTE — Telephone Encounter (Signed)
 Requested Prescriptions  Pending Prescriptions Disp Refills   losartan (COZAAR) 25 MG tablet 90 tablet 3    Sig: Take 1 tablet (25 mg total) by mouth daily.     Cardiovascular:  Angiotensin Receptor Blockers Failed - 12/18/2023  2:39 PM      Failed - Cr in normal range and within 180 days    Creat  Date Value Ref Range Status  11/27/2023 1.58 (H) 0.70 - 1.35 mg/dL Final   Creatinine, Urine  Date Value Ref Range Status  12/11/2023 100 20 - 320 mg/dL Final         Failed - Valid encounter within last 6 months    Recent Outpatient Visits           2 years ago Routine general medical examination at a health care facility   [facility] Pickard, Priscille Heidelberg, MD   2 years ago Hip pain, bilateral   [facility] Tanya Nones, Priscille Heidelberg, MD   3 years ago Routine general medical examination at a health care facility   [facility] Donita Brooks, MD   4 years ago Chronic kidney disease (CKD), stage III (moderate) (HCC)   [facility] Donita Brooks, MD   4 years ago Subclinical hypothyroidism   [facility] Tanya Nones, Priscille Heidelberg, MD              Passed - K in normal range and within 180 days    Potassium  Date Value Ref Range Status  11/27/2023 4.6 3.5 - 5.3 mmol/L Final         Passed - Patient is not pregnant      Passed - Last BP in normal range    BP Readings from Last 1 Encounters:  12/11/23 136/72

## 2023-12-18 NOTE — Telephone Encounter (Signed)
 Copied from CRM [id number]. Topic: Clinical - Medication Refill >> Dec 18, 2023  2:24 PM Carlatta H wrote: Most Recent Primary Care Visit:  Provider: Lynnea Ferrier T  Department: [facility]  Visit Type: PHYSICAL  Date: 12/11/2023  Medication: Rx #: [id number] losartan (COZAAR) 25 MG tablet [[id number]]     Has the patient contacted their pharmacy? Yes (Agent: If no, request that the patient contact the pharmacy for the refill. If patient does not wish to contact the pharmacy document the reason why and proceed with request.) (Agent: If yes, when and what did the pharmacy advise?)  Is this the correct pharmacy for this prescription? Yes If no, delete pharmacy and type the correct one.  This is the patient's preferred pharmacy:    [facility] [address] - [place], [place] - [address] [place] [address] [place] [address] [place] [address] Phone: [phone] Fax: [phone]   Has the prescription been filled recently? No  Is the patient out of the medication? Yes  Has the patient been seen for an appointment in the last year OR does the patient have an upcoming appointment? Yes  Can we respond through MyChart? No  Agent: Please be advised that Rx refills may take up to 3 business days. We ask that you follow-up with your pharmacy.

## 2023-12-19 ENCOUNTER — Other Ambulatory Visit (HOSPITAL_COMMUNITY): Payer: Self-pay

## 2023-12-19 ENCOUNTER — Encounter: Payer: Self-pay | Admitting: Family Medicine

## 2023-12-19 MED ORDER — LOSARTAN POTASSIUM 25 MG PO TABS: 25.0000 mg | ORAL_TABLET | Freq: Every day | ORAL | 3 refills | Status: AC

## 2023-12-19 NOTE — Telephone Encounter (Signed)
 Copied from CRM [id number]. Topic: Clinical - Prescription Issue >> Dec 19, 2023 11:58 AM Antony Haste wrote: Reason for CRM: PT's losartan (COZAAR) 25 MG tablet was sent to the wrong pharmacy yesterday, he is needing this sent to: [facility] [address] - [place], [place] - [address]  [address] [place] [address] Phone: [phone] Fax: [phone]

## 2023-12-20 ENCOUNTER — Other Ambulatory Visit (HOSPITAL_COMMUNITY): Payer: Self-pay

## 2023-12-20 NOTE — Telephone Encounter (Unsigned)
 Copied from CRM [id number]. Topic: Clinical - Prescription Issue >> Dec 20, 2023 11:13 AM Fuller Mandril wrote: Reason for CRM: Patient called in states he is still not able to get blood pressure medication filled at [facility]. Advised sent to them yesterday. He states he is there now and they are not able to fill due to previously being filled by [facility]. Needs someone to reach out to [facility] to [place] order so he can get from [facility]. Would like call back with update.

## 2024-01-09 ENCOUNTER — Other Ambulatory Visit

## 2024-01-09 DIAGNOSIS — N1831 Chronic kidney disease, stage 3a: Secondary | ICD-10-CM

## 2024-01-10 LAB — COMPREHENSIVE METABOLIC PANEL
AG Ratio: 2.1 (calc) (ref 1.0–2.5)
ALT: 29 U/L (ref 9–46)
AST: 30 U/L (ref 10–35)
Albumin: 4.4 g/dL (ref 3.6–5.1)
Alkaline phosphatase (APISO): 48 U/L (ref 35–144)
BUN: 21 mg/dL (ref 7–25)
CO2: 31 mmol/L (ref 20–32)
Calcium: 9.2 mg/dL (ref 8.6–10.3)
Chloride: 102 mmol/L (ref 98–110)
Creat: 1.22 mg/dL (ref 0.70–1.35)
Globulin: 2.1 g/dL (ref 1.9–3.7)
Glucose, Bld: 66 mg/dL (ref 65–99)
Potassium: 4.2 mmol/L (ref 3.5–5.3)
Sodium: 139 mmol/L (ref 135–146)
Total Bilirubin: 0.6 mg/dL (ref 0.2–1.2)
Total Protein: 6.5 g/dL (ref 6.1–8.1)

## 2024-02-07 ENCOUNTER — Other Ambulatory Visit (HOSPITAL_COMMUNITY): Payer: Self-pay

## 2024-02-07 DIAGNOSIS — L570 Actinic keratosis: Secondary | ICD-10-CM | POA: Diagnosis not present

## 2024-02-07 DIAGNOSIS — L853 Xerosis cutis: Secondary | ICD-10-CM | POA: Diagnosis not present

## 2024-02-07 DIAGNOSIS — D485 Neoplasm of uncertain behavior of skin: Secondary | ICD-10-CM | POA: Diagnosis not present

## 2024-02-07 DIAGNOSIS — B001 Herpesviral vesicular dermatitis: Secondary | ICD-10-CM | POA: Diagnosis not present

## 2024-02-07 DIAGNOSIS — X32XXXA Exposure to sunlight, initial encounter: Secondary | ICD-10-CM | POA: Diagnosis not present

## 2024-02-07 DIAGNOSIS — C44622 Squamous cell carcinoma of skin of right upper limb, including shoulder: Secondary | ICD-10-CM | POA: Diagnosis not present

## 2024-02-07 MED ORDER — ACYCLOVIR 400 MG PO TABS
400.0000 mg | ORAL_TABLET | Freq: Every day | ORAL | 5 refills | Status: AC
  Filled 2024-02-07: qty 45, 9d supply, fill #0
  Filled 2024-05-26: qty 45, 9d supply, fill #1

## 2024-02-18 ENCOUNTER — Other Ambulatory Visit: Payer: Self-pay | Admitting: Family Medicine

## 2024-02-18 ENCOUNTER — Other Ambulatory Visit (HOSPITAL_COMMUNITY): Payer: Self-pay

## 2024-02-19 ENCOUNTER — Other Ambulatory Visit (HOSPITAL_COMMUNITY): Payer: Self-pay

## 2024-02-19 MED ORDER — CYANOCOBALAMIN 1000 MCG/ML IJ SOLN
1000.0000 ug | INTRAMUSCULAR | 0 refills | Status: AC
  Filled 2024-02-19: qty 3, 90d supply, fill #0
  Filled 2024-05-26: qty 3, 90d supply, fill #1
  Filled 2024-09-01: qty 3, 90d supply, fill #2
  Filled 2024-12-03: qty 3, 90d supply, fill #3

## 2024-02-19 NOTE — Telephone Encounter (Signed)
 Last OV 12/11/23 Requested Prescriptions  Pending Prescriptions Disp Refills   cyanocobalamin  (VITAMIN B12) 1000 MCG/ML injection 12 mL 0    Sig: Inject 1 mL (1,000 mcg total) into the muscle every 30 (thirty) days.     Endocrinology:  Vitamins - Vitamin B12 Failed - 02/19/2024 11:16 AM      Failed - Valid encounter within last 12 months    Recent Outpatient Visits           2 months ago Stage 3a chronic kidney disease (HCC)   [facility] Austine Lefort, MD   1 year ago CKD (chronic kidney disease) stage 2, GFR 60-89 ml/min   [facility] Pickard, Cisco Crest, MD              Passed - HCT in normal range and within 360 days    HCT  Date Value Ref Range Status  11/27/2023 43.3 38.5 - 50.0 % Final         Passed - HGB in normal range and within 360 days    Hemoglobin  Date Value Ref Range Status  11/27/2023 14.5 13.2 - 17.1 g/dL Final         Passed - B12 Level in normal range and within 360 days    Vitamin B-12  Date Value Ref Range Status  11/27/2023 350 200 - 1,100 pg/mL Final    Comment:    . Please Note: Although the reference range for vitamin B12 is [phone] pg/mL, it has been reported that between 5 and 10% of patients with values between 200 and 400 pg/mL may experience neuropsychiatric and hematologic abnormalities due to occult B12 deficiency; less than 1% of patients with values above 400 pg/mL will have symptoms. Aaron Aas

## 2024-02-28 DIAGNOSIS — L905 Scar conditions and fibrosis of skin: Secondary | ICD-10-CM | POA: Diagnosis not present

## 2024-02-28 DIAGNOSIS — C44622 Squamous cell carcinoma of skin of right upper limb, including shoulder: Secondary | ICD-10-CM | POA: Diagnosis not present

## 2024-03-06 DIAGNOSIS — L570 Actinic keratosis: Secondary | ICD-10-CM | POA: Diagnosis not present

## 2024-03-06 DIAGNOSIS — X32XXXA Exposure to sunlight, initial encounter: Secondary | ICD-10-CM | POA: Diagnosis not present

## 2024-03-26 ENCOUNTER — Other Ambulatory Visit (HOSPITAL_COMMUNITY): Payer: Self-pay

## 2024-03-26 ENCOUNTER — Other Ambulatory Visit: Payer: Self-pay | Admitting: Family Medicine

## 2024-03-26 MED ORDER — LOSARTAN POTASSIUM 25 MG PO TABS
25.0000 mg | ORAL_TABLET | Freq: Every day | ORAL | 3 refills | Status: AC
  Filled 2024-03-26: qty 90, 90d supply, fill #0
  Filled 2024-07-07: qty 90, 90d supply, fill #1
  Filled 2024-10-15: qty 90, 90d supply, fill #2

## 2024-04-28 ENCOUNTER — Other Ambulatory Visit (HOSPITAL_COMMUNITY): Payer: Self-pay

## 2024-04-28 DIAGNOSIS — N3943 Post-void dribbling: Secondary | ICD-10-CM | POA: Diagnosis not present

## 2024-04-28 DIAGNOSIS — N5201 Erectile dysfunction due to arterial insufficiency: Secondary | ICD-10-CM | POA: Diagnosis not present

## 2024-04-28 DIAGNOSIS — F5221 Male erectile disorder: Secondary | ICD-10-CM | POA: Diagnosis not present

## 2024-04-28 DIAGNOSIS — R3912 Poor urinary stream: Secondary | ICD-10-CM | POA: Diagnosis not present

## 2024-04-28 DIAGNOSIS — N486 Induration penis plastica: Secondary | ICD-10-CM | POA: Diagnosis not present

## 2024-04-28 MED ORDER — TAMSULOSIN HCL 0.4 MG PO CAPS
0.4000 mg | ORAL_CAPSULE | Freq: Every day | ORAL | 11 refills | Status: AC
  Filled 2024-04-28: qty 30, 30d supply, fill #0

## 2024-05-08 ENCOUNTER — Other Ambulatory Visit (HOSPITAL_COMMUNITY): Payer: Self-pay

## 2024-05-26 ENCOUNTER — Other Ambulatory Visit: Payer: Self-pay

## 2024-05-26 ENCOUNTER — Other Ambulatory Visit (HOSPITAL_COMMUNITY): Payer: Self-pay

## 2024-07-07 ENCOUNTER — Other Ambulatory Visit: Payer: Self-pay

## 2024-07-07 ENCOUNTER — Other Ambulatory Visit (HOSPITAL_COMMUNITY): Payer: Self-pay

## 2024-09-01 ENCOUNTER — Other Ambulatory Visit (HOSPITAL_COMMUNITY): Payer: Self-pay

## 2024-09-04 ENCOUNTER — Other Ambulatory Visit (HOSPITAL_COMMUNITY): Payer: Self-pay

## 2024-09-04 DIAGNOSIS — L408 Other psoriasis: Secondary | ICD-10-CM | POA: Diagnosis not present

## 2024-09-04 DIAGNOSIS — X32XXXA Exposure to sunlight, initial encounter: Secondary | ICD-10-CM | POA: Diagnosis not present

## 2024-09-04 DIAGNOSIS — B001 Herpesviral vesicular dermatitis: Secondary | ICD-10-CM | POA: Diagnosis not present

## 2024-09-04 DIAGNOSIS — L570 Actinic keratosis: Secondary | ICD-10-CM | POA: Diagnosis not present

## 2024-09-04 MED ORDER — FLUOCINONIDE 0.05 % EX SOLN
1.0000 | Freq: Every day | CUTANEOUS | 0 refills | Status: AC
  Filled 2024-09-04: qty 60, 30d supply, fill #0

## 2024-09-04 MED ORDER — VALACYCLOVIR HCL 500 MG PO TABS
500.0000 mg | ORAL_TABLET | Freq: Every day | ORAL | 3 refills | Status: AC
  Filled 2024-09-04: qty 90, 90d supply, fill #0
  Filled 2024-12-03: qty 90, 90d supply, fill #1

## 2024-10-15 ENCOUNTER — Other Ambulatory Visit (HOSPITAL_COMMUNITY): Payer: Self-pay

## 2024-11-04 ENCOUNTER — Ambulatory Visit: Admitting: Family Medicine

## 2024-11-04 ENCOUNTER — Encounter: Payer: Self-pay | Admitting: Family Medicine

## 2024-11-04 ENCOUNTER — Other Ambulatory Visit: Payer: Self-pay

## 2024-11-04 VITALS — BP 120/78 | Ht 74.0 in | Wt 200.0 lb

## 2024-11-04 DIAGNOSIS — S46211A Strain of muscle, fascia and tendon of other parts of biceps, right arm, initial encounter: Secondary | ICD-10-CM

## 2024-11-04 DIAGNOSIS — M75111 Incomplete rotator cuff tear or rupture of right shoulder, not specified as traumatic: Secondary | ICD-10-CM

## 2024-11-04 NOTE — Progress Notes (Signed)
 DATE OF VISIT: 11/04/2024        Alex Caldwell DOB: 02/03/1961 MRN: [id number]  Discussed the use of AI scribe software for clinical note transcription with the patient, who gave verbal consent to proceed.  History of Present Illness Alex Caldwell is a 64 year old male with prior left distal biceps tendon rupture and left rotator cuff repair who presents for evaluation of acute right upper arm injury sustained during pickleball.  Right upper arm deformity and discomfort - Acute onset of right upper arm deformity during pickleball on Friday afternoon 10/31/24 - Experienced a twinge in the right upper arm without significant pain at the time of injury - Companion observed visible deformity, described as looking a little funny - No ecchymosis, edema, paresthesia, or radiation of symptoms to the hand or fingers - Mild discomfort in the upper arm for the first 24-48 hours, with minimal and non-persistent pain - Took acetaminophen  on the first day in anticipation of pain, but discontinued as pain did not develop  Prior orthopedic evaluation and management - Evaluated by orthopedic surgeon yesterday in [place], [place], area where he lives - Advised against surgical intervention due to risk of further damage - Physical therapy recommended and scheduled to begin Friday - No imaging performed at that time  Musculoskeletal surgical history - History of left distal biceps tendon rupture approximately 14-15 years ago, treated surgically - Prior left rotator cuff repair - treated surgically - History of left ACL surgery - No prior rotator cuff issues on the right side  Physical activity and functional status - Remains physically active, working out daily at a gym and playing pickleball - 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   acyclovir  (ZOVIRAX ) 400 MG tablet Take 1 tablet (400 mg total) by mouth 5 (five) times daily during flares, otherwise take 1 tablet once daily for maintenance.   cyanocobalamin  (VITAMIN B12) 1000 MCG/ML injection Inject 1 mL (1,000 mcg total) into the muscle every 30 (thirty) days.   fluocinonide  (LIDEX ) 0.05 % external solution Apply 1 Application topically to scalp once daily.   losartan  (COZAAR ) 25 MG tablet Take 1 tablet (25 mg total) by mouth daily.   losartan  (COZAAR ) 25 MG tablet Take 1 tablet (25 mg total) by mouth daily.   tamsulosin  (FLOMAX ) 0.4 MG CAPS capsule Take 1 capsule (0.4 mg total) by mouth at bedtime.   valACYclovir  (VALTREX ) 500 MG tablet Take 1 tablet (500 mg total) by mouth daily for maintenance and up to 2 tablets (1,000 mg total) daily for flares.   No facility-administered encounter medications on file as of 11/04/2024.    Allergies: has no known allergies.  Physical Examination: Vitals: BP 120/78   Ht 6' 2 (1.88 m)   Wt 200 lb (90.7 kg)   BMI 25.68 kg/m  GENERAL:  Alex Caldwell is a 64 y.o. male appearing their stated age, alert and oriented x 3, in no apparent distress.  SKIN: no rashes or lesions, skin clean, dry, intact MSK: Shoulder: Right upper arm with visible deformity with positive Popeye deformity consistent with long head biceps rupture.  No other  abnormalities appreciated.  Shoulder with full range of motion without pain.  He has minimal tenderness over the bicipital groove, no tenderness over the [facility] joint or the greater tuberosity.  Negative empty can, negative Hawkins, negative Neer, negative speeds.  Bicep strength 5/5, rotator cuff strength 5/5 throughout. Left shoulder with full range of motion pain, weakness, instability NEURO: sensation intact to light  touch, DTR 2/4 bicep, tricep brachioradialis bilaterally VASC: pulses 2+ and symmetric artery bilaterally, no edema  Radiology: MSK ultrasound right shoulder Date: 11/04/2024 Indication: Right upper arm pain rule out biceps rupture Findings: - Bicipital groove well-visualized, absence of long head of the biceps tendon.  Collection of anechoic fluid noted within the biceps tendon sheath along the bicipital groove and typical location of long head of the biceps.  Findings consistent with long head of the biceps rupture - Normal pectoralis attachment on the proximal humerus - Subscapularis with partial tear at the insertional footplate, some scarring is present.  No internal impingement on the coracoid - AC joint with degenerative spurring, positive geyser sign - Small amount of increased fluid in the subacromial bursa with mild dynamic impingement - Supraspinatus with small articular-sided partial tearing, small cortical irregularity along the humeral head noted.  Also partial tearing along the insertional footplate.  No full-thickness tearing appreciated. - Partial tearing of the infraspinatus, no full-thickness tearing - Normal-appearing teres minor - Normal-appearing posterior glenohumeral joint  Impression: - Long head of the biceps rupture - Partial rotator cuff tear of the subscapularis, supraspinatus, infraspinatus - AC joint osteoarthritis Images and interpretation completed by Rainell Cedar, DO   Assessment & Plan Rupture of right long head of biceps tendon Confirmed complete rupture with distal retraction and Popeye deformity. Retained good strength and minimal pain. Surgical repair not recommended due to high complication risk and minimal functional benefit. - Performed diagnostic ultrasound to confirm rupture and assess additional pathology. - Recommend he start physical therapy to strengthen remaining biceps and stabilize rotator cuff. - Educated on natural history, expected  cosmetic changes, and strength preservation. - Advised against surgical intervention due to high complication risk and minimal benefit. - Encouraged follow-up if new symptoms develop or pain persists post-therapy.  Partial rotator cuff tear, right shoulder Ultrasound showed chronic partial tearing with mild bursal fluid and acromioclavicular joint arthropathy. No new rotator cuff injury associated with biceps rupture. - Assessed rotator cuff via ultrasound, confirming chronic partial tearing. - Recommended physical therapy for stabilization and injury prevention. - Reassured no new rotator cuff tear; findings consistent with chronic changes. - Advised monitoring for new pain or dysfunction and follow-up if symptoms worsen.  And follow-up with orthopedist near his home in [facility]  if needed, he is welcome to follow-up with us  as needed if he is in town visiting family.  Encouraged to reach out with any questions or concerns.   Patient expressed understanding & agreement with above.  Encounter Diagnoses  Name Primary?   Rupture of right proximal biceps tendon, initial encounter Yes   Nontraumatic incomplete tear of right rotator cuff     Orders Placed This Encounter  Procedures   US  COMPLETE JOINT SPACE STRUCTURES UP RIGHT     Contains text generated by [employer]bridge.  11/04/24

## 2024-12-01 ENCOUNTER — Other Ambulatory Visit

## 2024-12-03 ENCOUNTER — Other Ambulatory Visit (HOSPITAL_COMMUNITY): Payer: Self-pay

## 2024-12-04 ENCOUNTER — Other Ambulatory Visit (HOSPITAL_COMMUNITY): Payer: Self-pay

## 2024-12-04 ENCOUNTER — Other Ambulatory Visit

## 2024-12-04 DIAGNOSIS — N1831 Chronic kidney disease, stage 3a: Secondary | ICD-10-CM

## 2024-12-04 DIAGNOSIS — Z125 Encounter for screening for malignant neoplasm of prostate: Secondary | ICD-10-CM

## 2024-12-04 DIAGNOSIS — R5383 Other fatigue: Secondary | ICD-10-CM

## 2024-12-04 DIAGNOSIS — Z0000 Encounter for general adult medical examination without abnormal findings: Secondary | ICD-10-CM

## 2024-12-04 DIAGNOSIS — E7800 Pure hypercholesterolemia, unspecified: Secondary | ICD-10-CM

## 2024-12-05 ENCOUNTER — Ambulatory Visit: Payer: Self-pay | Admitting: Family Medicine

## 2024-12-05 LAB — CBC WITH DIFFERENTIAL/PLATELET
Absolute Lymphocytes: 1534 {cells}/uL (ref 850–3900)
Absolute Monocytes: 407 {cells}/uL (ref 200–950)
Basophils Absolute: 29 {cells}/uL (ref 0–200)
Basophils Relative: 0.6 %
Eosinophils Absolute: 88 {cells}/uL (ref 15–500)
Eosinophils Relative: 1.8 %
HCT: 43.8 % (ref 39.4–51.1)
Hemoglobin: 15 g/dL (ref 13.2–17.1)
MCH: 29.9 pg (ref 27.0–33.0)
MCHC: 34.2 g/dL (ref 31.6–35.4)
MCV: 87.3 fL (ref 81.4–101.7)
MPV: 10 fL (ref 7.5–12.5)
Monocytes Relative: 8.3 %
Neutro Abs: 2842 {cells}/uL (ref 1500–7800)
Neutrophils Relative %: 58 %
Platelets: 207 10*3/uL (ref 140–400)
RBC: 5.02 Million/uL (ref 4.20–5.80)
RDW: 12.5 % (ref 11.0–15.0)
Total Lymphocyte: 31.3 %
WBC: 4.9 10*3/uL (ref 3.8–10.8)

## 2024-12-05 LAB — COMPLETE METABOLIC PANEL WITHOUT GFR
AG Ratio: 2.1 (calc) (ref 1.0–2.5)
ALT: 28 U/L (ref 9–46)
AST: 23 U/L (ref 10–35)
Albumin: 4.8 g/dL (ref 3.6–5.1)
Alkaline phosphatase (APISO): 48 U/L (ref 35–144)
BUN: 17 mg/dL (ref 7–25)
CO2: 30 mmol/L (ref 20–32)
Calcium: 9.3 mg/dL (ref 8.6–10.3)
Chloride: 99 mmol/L (ref 98–110)
Creat: 1.32 mg/dL (ref 0.70–1.35)
Globulin: 2.3 g/dL (ref 1.9–3.7)
Glucose, Bld: 92 mg/dL (ref 65–99)
Potassium: 4.4 mmol/L (ref 3.5–5.3)
Sodium: 136 mmol/L (ref 135–146)
Total Bilirubin: 0.7 mg/dL (ref 0.2–1.2)
Total Protein: 7.1 g/dL (ref 6.1–8.1)

## 2024-12-05 LAB — LIPID PANEL
Cholesterol: 204 mg/dL — ABNORMAL HIGH
HDL: 62 mg/dL
LDL Cholesterol (Calc): 124 mg/dL — ABNORMAL HIGH
Non-HDL Cholesterol (Calc): 142 mg/dL — ABNORMAL HIGH
Total CHOL/HDL Ratio: 3.3 (calc)
Triglycerides: 81 mg/dL

## 2024-12-05 LAB — TESTOSTERONE: Testosterone: 904 ng/dL — ABNORMAL HIGH (ref 250–827)

## 2024-12-05 LAB — VITAMIN B12: Vitamin B-12: 302 pg/mL (ref 200–1100)

## 2024-12-05 LAB — PSA: PSA: 0.49 ng/mL

## 2024-12-08 ENCOUNTER — Other Ambulatory Visit: Payer: No Typology Code available for payment source

## 2024-12-11 ENCOUNTER — Encounter: Payer: No Typology Code available for payment source | Admitting: Family Medicine
